# Patient Record
Sex: Male | Born: 1950 | Race: White | Hispanic: No | Marital: Married | Smoking: Never smoker
Health system: Southern US, Community
[De-identification: ages and names within clinical notes are randomized; demographics above are authoritative.]

## PROBLEM LIST (undated history)

## (undated) DIAGNOSIS — I509 Heart failure, unspecified: Secondary | ICD-10-CM

## (undated) DIAGNOSIS — IMO0002 Reserved for concepts with insufficient information to code with codable children: Secondary | ICD-10-CM

## (undated) DIAGNOSIS — J45909 Unspecified asthma, uncomplicated: Secondary | ICD-10-CM

## (undated) DIAGNOSIS — I10 Essential (primary) hypertension: Secondary | ICD-10-CM

## (undated) DIAGNOSIS — E119 Type 2 diabetes mellitus without complications: Secondary | ICD-10-CM

## (undated) DIAGNOSIS — E785 Hyperlipidemia, unspecified: Secondary | ICD-10-CM

## (undated) DIAGNOSIS — G4730 Sleep apnea, unspecified: Secondary | ICD-10-CM

## (undated) HISTORY — DX: Essential (primary) hypertension: I10

## (undated) HISTORY — DX: Type 2 diabetes mellitus without complications: E11.9

## (undated) HISTORY — DX: Unspecified asthma, uncomplicated: J45.909

## (undated) HISTORY — DX: Reserved for concepts with insufficient information to code with codable children: IMO0002

## (undated) HISTORY — DX: Sleep apnea, unspecified: G47.30

## (undated) HISTORY — DX: Hyperlipidemia, unspecified: E78.5

---

## 1994-05-14 ENCOUNTER — Emergency Department: Admit: 1994-05-14 | Payer: Self-pay | Source: Emergency Department

## 2008-05-20 ENCOUNTER — Emergency Department
Admission: EM | Admit: 2008-05-20 | Disposition: A | Discharge status: Home / Self Care | Payer: Self-pay | Source: Emergency Department | Admitting: Emergency Medicine

## 2009-11-12 ENCOUNTER — Emergency Department
Admission: EM | Admit: 2009-11-12 | Disposition: A | Discharge status: Home / Self Care | Payer: Self-pay | Source: Emergency Department | Admitting: Emergency Medicine

## 2009-11-13 LAB — CBC AND DIFFERENTIAL
BASOPHILS %: 0.2 % (ref 0.0–2.0)
Baso(Absolute): 0.02 10*3/uL (ref 0.00–0.20)
Eosinophils %: 0.8 % (ref 0.0–6.0)
Eosinophils Absolute: 0.07 10*3/uL — ABNORMAL LOW (ref 0.10–0.30)
Hematocrit: 40.6 % (ref 39.0–49.0)
Hemoglobin: 13.4 g/dL (ref 13.2–17.3)
Immature Granulocytes #: 0.02 10*3/uL (ref 0.00–0.05)
Immature Granulocytes %: 0.2 % — ABNORMAL HIGH (ref 0.0–0.0)
Lymphocytes Absolute: 2.23 10*3/uL (ref 1.00–4.80)
Lymphocytes Relative: 24.2 % — ABNORMAL LOW (ref 25.0–55.0)
MCH: 28.2 pg (ref 27.0–34.0)
MCHC: 33 g/dL (ref 32.0–36.0)
MCV: 85.5 fL (ref 80–100)
MPV: 10.4 fL (ref 9.0–13.0)
Monocytes Absolute: 0.94 10*3/uL (ref 0.10–1.20)
Monocytes Relative %: 10.2 % — ABNORMAL HIGH (ref 1.0–8.0)
Neutrophils Absolute: 5.97 10*3/uL (ref 1.80–7.70)
Neutrophils Relative %: 64.6 % (ref 49.0–69.0)
Nucleated RBC %: 0 /100WBC (ref 0.0–0.0)
Nucleted RBC #: 0 10*3/uL (ref 0.00–0.00)
Platelets: 271 10*3/uL (ref 150–400)
RBC: 4.75 M/uL (ref 3.80–5.40)
RDW: 14.2 % — ABNORMAL HIGH (ref 11.0–14.0)
WBC: 9.23 10*3/uL (ref 4.80–10.80)

## 2009-11-13 LAB — COMPREHENSIVE METABOLIC PANEL
ALT: 91 U/L — ABNORMAL HIGH (ref 7–56)
AST (SGOT): 44 U/L — ABNORMAL HIGH (ref 5–40)
Albumin, Synovial: 4.1 g/dL (ref 3.9–5.0)
Alkaline Phosphatase: 160 U/L — ABNORMAL HIGH (ref 38–126)
BUN / Creatinine Ratio: 17 (ref 8–20)
BUN: 19 mg/dL (ref 6–20)
Bilirubin, Total: 0.1 mg/dL — ABNORMAL LOW (ref 0.2–1.3)
CO2: 29 mmol/L (ref 21.0–31.0)
Calcium: 8.5 mg/dL (ref 8.4–10.2)
Chloride: 104 mmol/L (ref 101–111)
Creatinine: 1.17 mg/dL (ref 0.66–1.25)
EGFR: >60 mL/min/{1.73_m2}
EGFR: >60 mL/min/{1.73_m2}
Glucose: 128 mg/dL — ABNORMAL HIGH (ref 70–100)
Potassium: 4.4 mmol/L (ref 3.6–5.0)
Protein, Total: 6.6 g/dL (ref 6.3–8.2)
Sodium: 143 mmol/L (ref 135–145)

## 2009-11-13 LAB — B-TYPE NATRIURETIC PEPTIDE: B-Natriuretic Peptide: 13 pg/mL (ref 0–100)

## 2009-11-13 LAB — MAGNESIUM: Magnesium: 2.3 mg/dL — ABNORMAL HIGH (ref 1.7–2.2)

## 2010-10-27 ENCOUNTER — Ambulatory Visit
Admission: RE | Admit: 2010-10-27 | Disposition: A | Discharge status: Home / Self Care | Payer: Self-pay | Source: Ambulatory Visit

## 2012-03-12 LAB — ECG 12-LEAD
Atrial Rate: 81 {beats}/min
P Axis: 27 degrees
P-R Interval: 156 ms
Q-T Interval: 390 ms
QRS Duration: 96 ms
QTC Calculation (Bezet): 453 ms
R Axis: -2 degrees
T Axis: 11 degrees
Ventricular Rate: 81 {beats}/min

## 2012-04-06 ENCOUNTER — Ambulatory Visit: Payer: Self-pay

## 2012-04-13 ENCOUNTER — Ambulatory Visit: Payer: BLUE CROSS/BLUE SHIELD | Attending: Specialist

## 2012-04-13 VITALS — BP 155/94 | Ht 71.0 in | Wt 261.0 lb

## 2012-04-13 DIAGNOSIS — E1149 Type 2 diabetes mellitus with other diabetic neurological complication: Secondary | ICD-10-CM | POA: Insufficient documentation

## 2012-04-13 NOTE — Progress Notes (Signed)
Nutrition Assessment    Eating History:  Meal times:  Breakfast 10am   Lunch  2pm   Supper 7:30pm  Do you eat snacks? No  When?      Weight Category: Obesity Grade II  Last Vitals: Ht 1.803 m (5\' 11" )  Wt 118.389 kg (261 lb)  BMI 36.40 kg/m2  Pre Pregnant Weight   Recommended Weight goal  132-179 pounds  Est calorie needs  1893  Activity factor: Sedentary - 600 calories for weight loss  Total carbs recommended  212  Number of carb choices recommended 14    Comments:

## 2012-04-13 NOTE — Progress Notes (Signed)
Kalman here 1 week early for assessment visit. Retired Technical sales engineer x 1 month. Seen by Dr Delynn Flavin A1c was 11% he thinks it's around 7 % now. Wife has diabetes on insulin. Glade states she knows a lot about diabetes but it is time he did. Recent 60 pound weight gain, he attributes this to back pain and untreated sleep apnea - he is claustrophobic and unable to tolerate devices. He has not checked BG in last month and often misses doses of Glucophage. Willing to check 3x day for class and write notes in log. Will get pill box to assist in regular medication times.  Blood pressure elevated today at 155/94, gave [employer] and asked that he check serial B/P and report elevations to MD. Dislikes most vegetables.

## 2012-04-22 ENCOUNTER — Ambulatory Visit: Payer: BLUE CROSS/BLUE SHIELD | Attending: Specialist

## 2012-04-22 DIAGNOSIS — E1149 Type 2 diabetes mellitus with other diabetic neurological complication: Secondary | ICD-10-CM | POA: Insufficient documentation

## 2012-04-22 NOTE — Progress Notes (Signed)
Fasting's are 120-130. Testing more that 3 x daily. Richard Lawrence states [name] wants more readings and encourages hs test.

## 2012-05-01 ENCOUNTER — Ambulatory Visit: Payer: BLUE CROSS/BLUE SHIELD | Admitting: Registered"

## 2012-05-01 NOTE — Progress Notes (Signed)
Reports 12.2% a1c decreased to 6.5% and loss of 10lbs.  Food records show good understanding of carb counting. Clarified a few portion sizes and recommend consistent carb amounts.

## 2012-06-05 ENCOUNTER — Ambulatory Visit: Payer: BLUE CROSS/BLUE SHIELD | Attending: Specialist

## 2012-06-05 DIAGNOSIS — E1149 Type 2 diabetes mellitus with other diabetic neurological complication: Secondary | ICD-10-CM | POA: Insufficient documentation

## 2012-07-06 ENCOUNTER — Ambulatory Visit: Payer: BLUE CROSS/BLUE SHIELD | Attending: Specialist | Admitting: Registered"

## 2012-07-06 VITALS — BP 138/97 | Wt 261.0 lb

## 2012-07-06 DIAGNOSIS — E1149 Type 2 diabetes mellitus with other diabetic neurological complication: Secondary | ICD-10-CM | POA: Insufficient documentation

## 2012-12-28 ENCOUNTER — Inpatient Hospital Stay: Payer: BLUE CROSS/BLUE SHIELD | Admitting: Internal Medicine

## 2012-12-28 ENCOUNTER — Inpatient Hospital Stay
Admission: EM | Admit: 2012-12-28 | Discharge: 2012-12-31 | DRG: 603 | Disposition: A | Discharge status: Home / Self Care | Payer: BLUE CROSS/BLUE SHIELD | Attending: Internal Medicine | Admitting: Internal Medicine

## 2012-12-28 DIAGNOSIS — E119 Type 2 diabetes mellitus without complications: Secondary | ICD-10-CM | POA: Diagnosis present

## 2012-12-28 DIAGNOSIS — D72829 Elevated white blood cell count, unspecified: Secondary | ICD-10-CM | POA: Diagnosis present

## 2012-12-28 DIAGNOSIS — Z880 Allergy status to penicillin: Secondary | ICD-10-CM

## 2012-12-28 DIAGNOSIS — L0201 Cutaneous abscess of face: Principal | ICD-10-CM | POA: Diagnosis present

## 2012-12-28 DIAGNOSIS — J45909 Unspecified asthma, uncomplicated: Secondary | ICD-10-CM | POA: Diagnosis present

## 2012-12-28 DIAGNOSIS — I509 Heart failure, unspecified: Secondary | ICD-10-CM | POA: Diagnosis present

## 2012-12-28 DIAGNOSIS — L03211 Cellulitis of face: Principal | ICD-10-CM | POA: Diagnosis present

## 2012-12-28 DIAGNOSIS — E785 Hyperlipidemia, unspecified: Secondary | ICD-10-CM | POA: Diagnosis present

## 2012-12-28 DIAGNOSIS — G4730 Sleep apnea, unspecified: Secondary | ICD-10-CM | POA: Diagnosis present

## 2012-12-28 DIAGNOSIS — I10 Essential (primary) hypertension: Secondary | ICD-10-CM | POA: Diagnosis present

## 2012-12-28 DIAGNOSIS — M5126 Other intervertebral disc displacement, lumbar region: Secondary | ICD-10-CM | POA: Diagnosis present

## 2012-12-28 HISTORY — DX: Heart failure, unspecified: I50.9

## 2012-12-28 LAB — CBC AND DIFFERENTIAL
Basophils Absolute Automated: 0.02 (ref 0.00–0.20)
Basophils Automated: 0 %
Eosinophils Absolute Automated: 0.17 (ref 0.00–0.70)
Eosinophils Automated: 2 %
Hematocrit: 42.6 % (ref 42.0–52.0)
Hgb: 13.8 g/dL (ref 13.0–17.0)
Immature Granulocytes Absolute: 0.02
Immature Granulocytes: 0 %
Lymphocytes Absolute Automated: 2.66 (ref 0.50–4.40)
Lymphocytes Automated: 24 %
MCH: 28.2 pg (ref 28.0–32.0)
MCHC: 32.4 g/dL (ref 32.0–36.0)
MCV: 87.1 fL (ref 80.0–100.0)
MPV: 10 fL (ref 9.4–12.3)
Monocytes Absolute Automated: 0.99 (ref 0.00–1.20)
Monocytes: 9 %
Neutrophils Absolute: 7.21 (ref 1.80–8.10)
Neutrophils: 65 %
Platelets: 313 (ref 140–400)
RBC: 4.89 (ref 4.70–6.00)
RDW: 14 % (ref 12–15)
WBC: 11.05 — ABNORMAL HIGH (ref 3.50–10.80)

## 2012-12-28 LAB — COMPREHENSIVE METABOLIC PANEL
ALT: 41 U/L (ref 0–55)
AST (SGOT): 20 U/L (ref 5–34)
Albumin/Globulin Ratio: 1.3 (ref 0.9–2.2)
Albumin: 4 g/dL (ref 3.5–5.0)
Alkaline Phosphatase: 102 U/L (ref 40–150)
Anion Gap: 10 (ref 5.0–15.0)
BUN: 21.2 mg/dL — ABNORMAL HIGH (ref 9.0–21.0)
Bilirubin, Total: 0.4 mg/dL (ref 0.2–1.2)
CO2: 30 — ABNORMAL HIGH (ref 22–29)
Calcium: 9.4 mg/dL (ref 8.5–10.5)
Chloride: 102 (ref 98–107)
Creatinine: 1.1 mg/dL (ref 0.7–1.3)
Globulin: 3.2 g/dL (ref 2.0–3.6)
Glucose: 129 mg/dL — ABNORMAL HIGH (ref 70–100)
Potassium: 4 (ref 3.5–5.1)
Protein, Total: 7.2 g/dL (ref 6.0–8.3)
Sodium: 142 (ref 136–145)

## 2012-12-28 LAB — POCT GLUCOSE
Whole Blood Glucose POCT: 90 mg/dL (ref 70–100)
Whole Blood Glucose POCT: 97 mg/dL (ref 70–100)
Whole Blood Glucose POCT: 87 mg/dL (ref 70–100)

## 2012-12-28 LAB — GFR: EGFR: >60

## 2012-12-28 LAB — C-REACTIVE PROTEIN: C-Reactive Protein: 0.5 mg/dL (ref 0.0–0.8)

## 2012-12-28 MED ORDER — DEXTROSE 50 % IV SOLN
25.0000 mL | INTRAVENOUS | Status: DC | PRN
Start: 2012-12-28 — End: 2012-12-31

## 2012-12-28 MED ORDER — GLUCOSE 40 % PO GEL
15.0000 g | ORAL | Status: DC | PRN
Start: 2012-12-28 — End: 2012-12-31

## 2012-12-28 MED ORDER — VANCOMYCIN HCL 1000 MG IV SOLR
1750.00 mg | Freq: Two times a day (BID) | INTRAVENOUS | Status: DC
Start: 2012-12-28 — End: 2012-12-30
  Administered 2012-12-28 – 2012-12-30 (×4): 1750 mg via INTRAVENOUS
  Filled 2012-12-28 (×5): qty 1750

## 2012-12-28 MED ORDER — ZOLPIDEM TARTRATE 5 MG PO TABS
5.0000 mg | ORAL_TABLET | Freq: Every evening | ORAL | Status: DC | PRN
Start: 2012-12-28 — End: 2012-12-31

## 2012-12-28 MED ORDER — FUROSEMIDE 40 MG PO TABS
40.00 mg | ORAL_TABLET | Freq: Two times a day (BID) | ORAL | Status: DC
Start: 2012-12-28 — End: 2012-12-30
  Administered 2012-12-28 – 2012-12-30 (×3): 40 mg via ORAL
  Filled 2012-12-28 (×5): qty 1

## 2012-12-28 MED ORDER — VANCOMYCIN 1000 MG IN 250 ML NS IVPB VIAL-MATE (CNR)
1000.0000 mg | Freq: Once | INTRAVENOUS | Status: AC
Start: 2012-12-28 — End: 2012-12-28
  Administered 2012-12-28: 1000 mg via INTRAVENOUS
  Filled 2012-12-28: qty 250

## 2012-12-28 MED ORDER — SODIUM CHLORIDE 0.9 % IV MBP
1.0000 g | INTRAVENOUS | Status: DC
Start: 2012-12-29 — End: 2012-12-31
  Administered 2012-12-29 – 2012-12-31 (×3): 1 g via INTRAVENOUS
  Filled 2012-12-28 (×3): qty 1000

## 2012-12-28 MED ORDER — RISAQUAD PO CAPS
1.0000 | ORAL_CAPSULE | Freq: Every day | ORAL | Status: DC
Start: 2012-12-28 — End: 2012-12-31
  Administered 2012-12-31: 1 via ORAL
  Filled 2012-12-28 (×4): qty 1

## 2012-12-28 MED ORDER — INSULIN ASPART 100 UNIT/ML SC SOLN
1.0000 [IU] | Freq: Three times a day (TID) | SUBCUTANEOUS | Status: DC | PRN
Start: 2012-12-28 — End: 2012-12-31

## 2012-12-28 MED ORDER — ENOXAPARIN SODIUM 40 MG/0.4ML SC SOLN
40.0000 mg | Freq: Every day | SUBCUTANEOUS | Status: DC
Start: 2012-12-28 — End: 2012-12-31
  Filled 2012-12-28 (×3): qty 0.4

## 2012-12-28 MED ORDER — MORPHINE SULFATE 2 MG/ML IJ/IV SOLN (WRAP)
2.0000 mg | Status: DC | PRN
Start: 2012-12-28 — End: 2012-12-31

## 2012-12-28 MED ORDER — ACETAMINOPHEN 325 MG PO TABS
650.0000 mg | ORAL_TABLET | ORAL | Status: DC | PRN
Start: 2012-12-28 — End: 2012-12-31

## 2012-12-28 MED ORDER — MONTELUKAST SODIUM 10 MG PO TABS
10.0000 mg | ORAL_TABLET | Freq: Every evening | ORAL | Status: DC
Start: 2012-12-28 — End: 2012-12-31
  Administered 2012-12-28 – 2012-12-30 (×3): 10 mg via ORAL
  Filled 2012-12-28 (×3): qty 1

## 2012-12-28 MED ORDER — VANCOMYCIN HCL 1000 MG IV SOLR
1000.0000 mg | Freq: Two times a day (BID) | INTRAVENOUS | Status: DC
Start: 2012-12-28 — End: 2012-12-28

## 2012-12-28 MED ORDER — ONDANSETRON HCL 4 MG/2ML IJ SOLN
4.0000 mg | Freq: Four times a day (QID) | INTRAMUSCULAR | Status: DC | PRN
Start: 2012-12-28 — End: 2012-12-31

## 2012-12-28 MED ORDER — CARVEDILOL 12.5 MG PO TABS
12.5000 mg | ORAL_TABLET | Freq: Every morning | ORAL | Status: DC
Start: 2012-12-28 — End: 2012-12-31
  Administered 2012-12-29 – 2012-12-31 (×3): 12.5 mg via ORAL
  Filled 2012-12-28 (×3): qty 1

## 2012-12-28 MED ORDER — CARVEDILOL 3.125 MG PO TABS
6.25 mg | ORAL_TABLET | Freq: Every day | ORAL | Status: DC
Start: 2012-12-28 — End: 2012-12-31
  Administered 2012-12-28 – 2012-12-30 (×3): 6.25 mg via ORAL
  Filled 2012-12-28 (×4): qty 2

## 2012-12-28 MED ORDER — GLUCAGON HCL (RDNA) 1 MG IJ SOLR
1.0000 mg | INTRAMUSCULAR | Status: DC | PRN
Start: 2012-12-28 — End: 2012-12-31

## 2012-12-28 MED ORDER — SODIUM CHLORIDE 0.9 % IV MBP
1.0000 g | Freq: Once | INTRAVENOUS | Status: AC
Start: 2012-12-28 — End: 2012-12-28
  Administered 2012-12-28: 1 g via INTRAVENOUS
  Filled 2012-12-28: qty 50
  Filled 2012-12-28: qty 1000

## 2012-12-28 MED ORDER — VANCOMYCIN HCL 1000 MG IV SOLR
1500.0000 mg | Freq: Once | INTRAVENOUS | Status: AC
Start: 2012-12-28 — End: 2012-12-28
  Administered 2012-12-28: 1500 mg via INTRAVENOUS
  Filled 2012-12-28: qty 1500

## 2012-12-28 NOTE — ED Notes (Signed)
Redness beginning Thursday at tip of nose; progressed throughout week [facility] and diagnosed with cellulitis; pt presents with worsening of redness and swelling to nose right eye and right cheek; denies difficulty swallowing/SOB/CP; took 50mg  benadryl 0530

## 2012-12-28 NOTE — H&P (Signed)
[facility] Hospitalist H&P.      Date Time: 12/28/2012  10:41 AM  Patient Name:Richard Lawrence  YSA:[id number]  PCP: Royal Piedra, MD  Attending Physician:Torrance Frech Erick Blinks M.D.,    Assessment/Plan   62 yo male here with facial cellulitis    Principal Problem:   *Facial cellulitis  Active Problems:   Diabetes mellitus without complication   Leucocytosis   HTN (hypertension)    1. Facial cellulitis   - continue IV abx with rocephin and vanc per ID recs  2. DM II  - SSI  3. HTN - continue home medications  4. Leukocytosis - 2nd to cellulitis      DVT Prohylaxis:lovenox  Code Status: No Order   Disposition:home  Prognosis:good  Type of Admission:observation  Estimated Length of Stay (including stay in the ER receiving treatment): 2-3 days  Medical Necessity for stay:facial cellulitis failed outpatient therapy    Chief Complaint:     Chief Complaint   Patient presents with   . Facial Redness        History of Present Illness:   Richard Lawrence is a 62 y.o. male who has history of History reviewed. No pertinent past surgical history.   Past Medical History   Diagnosis Date   . Diabetes mellitus type II    . Sleep apnea      untreated not able to tolerate mask or nose pillow   . Essential hypertension    . Hyperlipidemia    . Asthma    . Herniated disc      L4 L5 L6   . CHF (congestive heart failure)     came with the chief complaint of facial cellulitis.  Pt started facial redness and swelling started a couple days ago.  He went to urgent care and was given abx.  He took abx but symptoms continue to worsen.  He states he has redness, warmth, swelling and chills, no tenderness, no fever.  He has no visual problem, no blurry vision, no double vision, no cp, no sob, no abd pain, no nausea, no vomiting, no weakness numbness tingling.     Past Medical History:     Past Medical History   Diagnosis Date   . Diabetes mellitus type II    . Sleep apnea      untreated not able to tolerate mask or nose pillow   . Essential hypertension    .  Hyperlipidemia    . Asthma    . Herniated disc      L4 L5 L6   . CHF (congestive heart failure)        Past Surgical History:     History reviewed. No pertinent past surgical history.    Family History:     DM, cancer, HTN    Social History:     History   Alcohol Use No     History   Drug Use Not on file     History   Smoking status   . Never Smoker    Smokeless tobacco   . Not on file     History     Social History   . Marital Status: Married     Spouse Name: N/A     Number of Children: N/A   . Years of Education: N/A     Social History Main Topics   . Smoking status: Never Smoker    . Smokeless tobacco: None   . Alcohol Use: No   . Drug  Use: None   . Sexually Active: None     Other Topics Concern   . None     Social History Narrative   . None       Allergies:     Allergies   Allergen Reactions   . Erythromycin Diarrhea   . Levaquin (Levofloxacin Hemihydrate) Angioedema   . Lipitor (Atorvastatin Calcium)    . Other      Molds and dust   . Penicillins Hives and Rash       Medications:     Prescriptions prior to admission   Medication Sig Dispense Refill   . carvedilol (COREG) 12.5 MG tablet Take 12.5 mg by mouth daily.       . carvedilol (COREG) 6.25 MG tablet Take 6.25 mg by mouth Daily after dinner.       . Furosemide (LASIX PO) Take by mouth. Takes 40 mg in AM and 80 mg in PM       . loratadine (CLARITIN) 10 MG tablet Take 20 mg by mouth daily.       . metFORMIN (GLUCOPHAGE) 500 MG tablet Take 1,000 mg by mouth 2 (two) times daily with meals.       . montelukast (SINGULAIR) 10 MG tablet Take 10 mg by mouth nightly.       Marland Kitchen albuterol (PROVENTIL) (5 MG/ML) 0.5% nebulizer solution Take 2.5 mg by nebulization every 6 (six) hours as needed.           Review of Systems:     Constitutional: Negative for chills, weight loss, malaise/fatigue and diaphoresis.   HENT: Negative for hearing loss, ear pain, + redness right side of nasal bridge, warmth and swelling, congestion, sore throat, neck pain, tinnitus and ear  discharge.    Eyes: Negative for blurred vision, double vision, photophobia, pain, discharge and redness.   Respiratory: Negative for cough, hemoptysis, sputum production, shortness of breath, wheezing and stridor.    Cardiovascular: Negative for chest pain, palpitations, orthopnea, claudication, leg swelling and PND.   Gastrointestinal: Negative for heartburn, nausea, vomiting, abdominal pain, diarrhea, constipation, blood in stool and melena.   Genitourinary: Negative for dysuria, urgency, frequency, hematuria and flank pain.   Musculoskeletal: Negative for myalgias, back pain, joint pain and falls.   Skin: Negative for itching and rash.   Neurological: Negative for dizziness, tingling, tremors, sensory change, speech change, focal weakness, seizures, loss of consciousness, weakness and headaches.   Endo/Heme/Allergies: Negative for environmental allergies and polydipsia. Does not bruise/bleed easily.   Psychiatric/Behavioral: Negative for depression, suicidal ideas, hallucinations, memory loss and substance abuse. The patient is not nervous/anxious and does not have insomnia.           Physical Exam:    height is 1.778 m (5\' 10" ) and weight is 116.574 kg (257 lb). His temporal artery temperature is 98.4 F (36.9 C). His blood pressure is 159/90 and his pulse is 88. His respiration is 18 and oxygen saturation is 100%.   Body mass index is 36.88 kg/(m^2).  Filed Vitals:    12/28/12 0621 12/28/12 0817 12/28/12 0948   BP: 178/88 141/96 159/90   Pulse: 89 72 88   Temp: 97.8 F (36.6 C)  98.4 F (36.9 C)   TempSrc:   Temporal Artery   Resp: 18     Height: 1.778 m (5\' 10" )     Weight: 116.574 kg (257 lb)     SpO2: 99% 93% 100%       Constitutional: Patient is oriented  to person, place, and time. Patient appears well-developed and well-nourished.   Head: Normocephalic and atraumatic.  Eyes- pupils equal and reactive, extraocular eye movements intact, sclera anicteric  Ears - bilateral TM's and external ear canals  normal, right ear normal, left ear normal  Nose - normal and patent, no erythema, discharge or polyps and normal nontender sinuses + redness right side of nasal bridge, warmth and swelling,  Mouth - mucous membranes moist, pharynx normal without lesions  Neck: Normal range of motion. Neck supple. No JVD present. No tracheal deviation present. No thyromegaly present.   Cardiovascular: Normal rate, regular rhythm, normal heart sounds and intact distal pulses.  Exam reveals no gallop and no friction rub. No murmur heard.  Pulmonary/Chest: Effort normal and breath sounds normal. No stridor. No respiratory distress. Patient has no wheezes. No rales were present.  Exhibits no tenderness.   Abdominal: Soft. Bowel sounds are normal. Patient exhibits no distension and no mass was palpable. There is no tenderness. There is no rebound and no guarding.   Musculoskeletal: Normal range of motion. Patient exhibits no edema and no tenderness.   Lymphadenopathy:  Patient has no cervical adenopathy.   Neurological: Patient is alert and oriented to person, place, and time and has normal reflexes. No cranial nerve deficit.  Normal muscle tone. Coordination normal.   Skin: Skin is warm. No rash noted. Patient is not diaphoretic. No erythema. No pallor.   Psychiatric: Has normal mood and affect. Behavior is normal. Judgment and thought content normal.          Labs:     Results for orders placed during the hospital encounter of 12/28/12   CBC AND DIFFERENTIAL       Component Value Range    WBC 11.05 (*) 3.50 - 10.80    RBC 4.89  4.70 - 6.00    Hgb 13.8  13.0 - 17.0 g/dL    Hematocrit 16.1  09.6 - 52.0 %    MCV 87.1  80.0 - 100.0 fL    MCH 28.2  28.0 - 32.0 pg    MCHC 32.4  32.0 - 36.0 g/dL    RDW 14  12 - 15 %    Platelets 313  140 - 400    MPV 10.0  9.4 - 12.3 fL    Neutrophils 65  None %    Lymphocytes Automated 24  None %    Monocytes 9  None %    Eosinophils Automated 2  None %    Basophils Automated 0  None %    Immature Granulocyte  0  None %    Neutrophils Absolute 7.21  1.80 - 8.10    Abs Lymph Automated 2.66  0.50 - 4.40    Abs Mono Automated 0.99  0.00 - 1.20    Abs Eos Automated 0.17  0.00 - 0.70    Absolute Baso Automated 0.02  0.00 - 0.20    Absolute Immature Granulocyte 0.02  0   COMPREHENSIVE METABOLIC PANEL       Component Value Range    Glucose 129 (*) 70 - 100 mg/dL    BUN 04.5 (*) 9.0 - 40.9 mg/dL    Creatinine 1.1  0.7 - 1.3 mg/dL    Sodium 811  914 - 782    Potassium 4.0  3.5 - 5.1    Chloride 102  98 - 107    CO2 30 (*) 22 - 29    CALCIUM 9.4  8.5 - 10.5  mg/dL    Protein, Total 7.2  6.0 - 8.3 g/dL    Albumin 4.0  3.5 - 5.0 g/dL    AST (SGOT) 20  5 - 34 U/L    ALT 41  0 - 55 U/L    Alkaline Phosphatase 102  40 - 150 U/L    Bilirubin, Total 0.4  0.2 - 1.2 mg/dL    Globulin 3.2  2.0 - 3.6 g/dL    Albumin/Globulin Ratio 1.3  0.9 - 2.2    Anion Gap 10.0  5.0 - 15.0   C-REACTIVE PROTEIN       Component Value Range    C-Reactive Protein 0.5  0.0 - 0.8 mg/dL   GFR       Component Value Range    EGFR >60.0         EKG       Rads:     Radiology Results (24 Hour)     ** No Results found for the last 24 hours. **        chest X-ray    Total Time spent for Admission:   65 minutes >50% in direct pt care      Signed by: Mary Sella 12/28/2012 10:41 AM

## 2012-12-28 NOTE — Consults (Signed)
Full consult dictated  Plan:  Vancomycin and ceftriaxone empirically  Follow exam, temp, wbc  Supportive care  Will tailor Abx according to pt's clinical course and Cx results  Thank you for allwoing me to participate in the care of this pt

## 2012-12-28 NOTE — Consults (Addendum)
Department of Pharmacy Vancomycin Dosing Consult:  Initiation    MRN: [id number]  Room/Bed: M247/M247-A    Richard Lawrence is being treated with Vancomycin for facial cellulitis.  Pharmacy was consulted to dose Vancomycin by Dr.Hartojo.    Dosing was based on the following parameters:  Pt Age:  62 y.o.  Pt Weight:  116.57kg  Pt Height:  70inches  Estimated Creatinine Clearance: 91.3 ml/min (based on Cr of 1.1).  Target trough: 17.5 mcg/mL    The following orders were entered in EPIC:  Vancomycin loading dose of 2500 mg IV( 1000 mg plus 1500 mg)   Vancomycin maintenance dose of 1750 mg IV every 12 hours.  Vancomycin trough prior to the 4th dose.    Pharmacy will monitor drug levels and kidney function and adjust doses as necessary.    Thank you for the consult.  Please contact the pharmacy with any questions at  [phone].    Signed:  Mearl Latin  Date/Time 12/28/2012 10:22 AM        [facility] Department of Pharmacy Vancomycin Kinetics Dosing Consult:  Dose Adjustment    MRN: [id number]  Room/Bed:  U202/R427-C    Richard Lawrence  is being treated with Vancomycin for acial cellulitis.  Pharmacy was consulted to dose Vancomycin by Dr. Ricka Burdock.    Based on the following parameters:  Pt Age:  62 y.o.  Pt Weight:  116.6kg  Pt Height:  70 inches  Estimated Creatinine Clearance: 91.3 ml/min (based on Cr of 1.1).  Target trough: 17.5 mcg/mL    Vancomycin Trough   Date Value Range Status   12/30/2012 19.3  10.0 - 20.0 ug/mL Final        Based on the current Vancomycin trough drawn prior to the 4th dose, Vancomycin dose was changed to 1500 mg IV every 12 hrs.    Please check Vancomycin trough prior to 4th dose.  Pharmacy will continue to monitor drug level and kidney function and adjust doses as necessary.    Thank you for the consult.  Please contact the pharmacy with any questions at  [phone].    Signed:  Harrie Foreman    Date/Time 12/30/2012 1:47 PM

## 2012-12-28 NOTE — ED Provider Notes (Signed)
Physician/Midlevel provider first contact with patient: 12/28/12 1610         History     Chief Complaint   Patient presents with   . Facial Redness      HPI Comments:   Chief Complaint: redness to face  Onset/Duration: 4-5 days  Quality: constant, worsening  Severity:moderate  Aggravating Factors: unknown, started as redness and tenderness on nose   Alleviating Factors: [facility], redness has spread laterally to r cheek and mild amount to left cheek  Associated Symptoms/ Additional Comments: no fever, mild pain, warmth. No cp/n/v/abd pain.        The history is provided by the patient.       Past Medical History   Diagnosis Date   . Diabetes mellitus type II    . Sleep apnea      untreated not able to tolerate mask or nose pillow   . Essential hypertension    . Hyperlipidemia    . Asthma    . Herniated disc      L4 L5 L6   . CHF (congestive heart failure)        History reviewed. No pertinent past surgical history.    No family history on file.    Social  History   Substance Use Topics   . Smoking status: Never Smoker    . Smokeless tobacco: Not on file   . Alcohol Use: No       .     Allergies   Allergen Reactions   . Erythromycin Diarrhea   . Levaquin (Levofloxacin Hemihydrate)    . Lipitor (Atorvastatin Calcium)    . Other      Molds and dust   . Penicillins        Current/Home Medications    ALBUTEROL (PROVENTIL) (5 MG/ML) 0.5% NEBULIZER SOLUTION    Take 2.5 mg by nebulization every 6 (six) hours as needed.    FUROSEMIDE (LASIX PO)    Take by mouth. Takes 40 mg in AM and 80 mg in PM    LORATADINE (CLARITIN) 10 MG TABLET    Take 20 mg by mouth daily.    METFORMIN (GLUCOPHAGE) 500 MG TABLET    Take 1,000 mg by mouth 2 (two) times daily with meals.    MONTELUKAST (SINGULAIR) 10 MG TABLET    Take 10 mg by mouth nightly.        Review of Systems   Constitutional: Negative for fever and chills.   HENT: Negative for sore throat and neck pain.    Respiratory: Negative for cough and  shortness of breath.    Cardiovascular: Negative for chest pain and palpitations.   Gastrointestinal: Negative for nausea, vomiting and abdominal pain.   Musculoskeletal: Negative for back pain.   Skin: Positive for rash. Negative for pallor and wound.   Neurological: Negative for dizziness, syncope, weakness and headaches.   All other systems reviewed and are negative.        Physical Exam    BP 178/88  Pulse 89  Temp 97.8 F (36.6 C)  Resp 18  Ht 1.778 m  Wt 116.574 kg  BMI 36.88 kg/m2  SpO2 99%    Physical Exam   Nursing note and vitals reviewed.  Constitutional: He is oriented to person, place, and time. He appears distressed.   HENT:   Head: Normocephalic and atraumatic.   Nose: Nose normal.   Mouth/Throat: Oropharynx is clear and moist. No  oropharyngeal exudate.   Eyes: Conjunctivae normal and EOM are normal.   Neck: Normal range of motion. Neck supple.   Cardiovascular: Normal rate, regular rhythm, normal heart sounds and intact distal pulses.    Pulmonary/Chest: Effort normal and breath sounds normal. No respiratory distress.   Abdominal: Soft. Bowel sounds are normal. He exhibits no distension. There is no tenderness. There is no CVA tenderness.   Musculoskeletal: Normal range of motion. He exhibits no edema.   Neurological: He is alert and oriented to person, place, and time. He has normal strength. No cranial nerve deficit or sensory deficit. Coordination normal.   Skin: Skin is warm and dry. Rash noted. There is erythema.        Erythema, warmth with minimal tenderness to nose to r face and some amount to the left face. No wound or drainage. Mild r eyelid swelling.       MDM and ED Course     ED Medication Orders      Start     Status Ordering Provider    12/28/12 [phone]   cefTRIAXone (ROCEPHIN) 1 g in sodium chloride 0.9 % 50 mL IVPB mini-bag plus   Once      Route: Intravenous  Ordered Dose: 1 g         Last MAR action:  New Bag Dede Dobesh    12/28/12 6213   vancomycin (VANCOCIN) 1000mg  in  sodium chloride 0.9% 250 mL IVPB   Once      Route: Intravenous  Ordered Dose: 1,000 mg         Acknowledged Helayne Seminole                 MDM  Number of Diagnoses or Management Options  Diagnosis management comments: DR. Helayne Seminole  is the primary attending for this patient and has obtained and performed the history, PE, and medical decision making for this patient.          ddx- cellulitis, allergic rxn.           Amount and/or Complexity of Data Reviewed  Clinical lab tests: ordered and reviewed      Pt given iv abx in ed.    Pt with signs and symptoms of cellulitis and is at potential risk for septicemia and worsening infection, failed op abx.  Therefore pt warrants admission for IV antibiotics.    I spoke to hospitalist regarding admission. Pt presentation, course and results relayed to admitting doctor.    Results     Procedure Component Value Units Date/Time    Comprehensive Metabolic Panel (CMP) [[id number]]  (Abnormal) Collected:12/28/12 0649    Specimen Information:Blood Updated:12/28/12 0712     Glucose 129 (H) mg/dL      BUN 69.6 (H) mg/dL      Creatinine 1.1 mg/dL      Sodium 295      Potassium 4.0      Chloride 102      CO2 30 (H)      CALCIUM 9.4 mg/dL      Protein, Total 7.2 g/dL      Albumin 4.0 g/dL      AST (SGOT) 20 U/L      ALT 41 U/L      Alkaline Phosphatase 102 U/L      Bilirubin, Total 0.4 mg/dL      Globulin 3.2 g/dL      Albumin/Globulin Ratio 1.3      Anion Gap 10.0  C-reactive Protein (CRP) [[id number]] Collected:12/28/12 0649    Specimen Information:Blood Updated:12/28/12 0712     C-Reactive Protein 0.5 mg/dL     GFR [[id number]] VFIEPPIRJ:18/84/16 0649     EGFR >60.0 Updated:12/28/12 0712    CBC and differential [[id number]]  (Abnormal) Collected:12/28/12 0648    Specimen Information:Blood / Blood Updated:12/28/12 0704     WBC 11.05 (H)      RBC 4.89      Hgb 13.8 g/dL      Hematocrit 01.0 %      MCV 87.1 fL      MCH 28.2 pg      MCHC 32.4 g/dL      RDW 14 %      Platelets 313      MPV  10.0 fL      Neutrophils 65 %      Lymphocytes Automated 24 %      Monocytes 9 %      Eosinophils Automated 2 %      Basophils Automated 0 %      Immature Granulocyte 0 %      Neutrophils Absolute 7.21      Abs Lymph Automated 2.66      Abs Mono Automated 0.99      Abs Eos Automated 0.17      Absolute Baso Automated 0.02      Absolute Immature Granulocyte 0.02           Radiology Results (24 Hour)     ** No Results found for the last 24 hours. **                  Procedures    Clinical Impression & Disposition     Clinical Impression  Final diagnoses:   Cellulitis of face   Diabetes        ED Disposition     Admit            New Prescriptions    No medications on file               Helayne Seminole, MD  12/28/12 [phone]

## 2012-12-29 LAB — COMPREHENSIVE METABOLIC PANEL
ALT: 37 U/L (ref 0–55)
AST (SGOT): 17 U/L (ref 5–34)
Albumin/Globulin Ratio: 1.3 (ref 0.9–2.2)
Albumin: 3.6 g/dL (ref 3.5–5.0)
Alkaline Phosphatase: 91 U/L (ref 40–150)
Anion Gap: 8 (ref 5.0–15.0)
BUN: 18.3 mg/dL (ref 9.0–21.0)
Bilirubin, Total: 0.5 mg/dL (ref 0.2–1.2)
CO2: 30 — ABNORMAL HIGH (ref 22–29)
Calcium: 9.4 mg/dL (ref 8.5–10.5)
Chloride: 105 (ref 98–107)
Creatinine: 1.1 mg/dL (ref 0.7–1.3)
Globulin: 2.7 g/dL (ref 2.0–3.6)
Glucose: 111 mg/dL — ABNORMAL HIGH (ref 70–100)
Potassium: 4 (ref 3.5–5.1)
Protein, Total: 6.3 g/dL (ref 6.0–8.3)
Sodium: 143 (ref 136–145)

## 2012-12-29 LAB — CBC AND DIFFERENTIAL
Basophils Absolute Automated: 0.02 (ref 0.00–0.20)
Basophils Automated: 0 %
Eosinophils Absolute Automated: 0.22 (ref 0.00–0.70)
Eosinophils Automated: 2 %
Hematocrit: 40.1 % — ABNORMAL LOW (ref 42.0–52.0)
Hgb: 13.2 g/dL (ref 13.0–17.0)
Immature Granulocytes Absolute: 0.04
Immature Granulocytes: 0 %
Lymphocytes Absolute Automated: 3 (ref 0.50–4.40)
Lymphocytes Automated: 26 %
MCH: 28.6 pg (ref 28.0–32.0)
MCHC: 32.9 g/dL (ref 32.0–36.0)
MCV: 86.8 fL (ref 80.0–100.0)
MPV: 9.8 fL (ref 9.4–12.3)
Monocytes Absolute Automated: 0.98 (ref 0.00–1.20)
Monocytes: 8 %
Neutrophils Absolute: 7.4 (ref 1.80–8.10)
Neutrophils: 64 %
Platelets: 281 (ref 140–400)
RBC: 4.62 — ABNORMAL LOW (ref 4.70–6.00)
RDW: 15 % (ref 12–15)
WBC: 11.62 — ABNORMAL HIGH (ref 3.50–10.80)

## 2012-12-29 LAB — GFR: EGFR: >60

## 2012-12-29 LAB — POCT GLUCOSE
Whole Blood Glucose POCT: 100 mg/dL (ref 70–100)
Whole Blood Glucose POCT: 113 mg/dL — AB (ref 70–100)
Whole Blood Glucose POCT: 137 mg/dL — AB (ref 70–100)
Whole Blood Glucose POCT: 117 mg/dL — AB (ref 70–100)

## 2012-12-29 LAB — HEMOGLOBIN A1C: Hemoglobin A1C: 6.2 % — ABNORMAL HIGH (ref 0.0–6.0)

## 2012-12-29 LAB — MAGNESIUM: Magnesium: 2.3 mg/dL (ref 1.6–2.6)

## 2012-12-29 NOTE — Progress Notes (Signed)
INFECTIOUS DISEASES PROGRESS NOTE  Richard Lawrence        Date Time: 12/29/2012 2:08 PM  Patient Name: Richard Lawrence    Patient Active Problem List    Diagnosis Date Noted   . Facial cellulitis 12/28/2012   . Leucocytosis 12/28/2012   . HTN (hypertension) 12/28/2012   . Type II or unspecified type diabetes mellitus with neurological manifestations, uncontrolled(250.62) 07/06/2012   . Diabetes mellitus without complication 04/22/2012           Assessment:     Facial cellulitis-failed PO doxy. On empiric vanc and ceftriaxone with minimal improvement so far   Leucocytosis- due to above   HTN (hypertension)   DM II    Plan:   Continue current Abx  Monitor the facial exam  Follow temp, wbc      Subjective:   Richard Lawrence today has, No fevers, No chills, No nightsweats,malaise   No headache, No chest pain,   No abdominal pain - No Nausea, No vomiting, No diarrhea,   No dysuria, No urgency, No frequency,   No Cough - SOB.   Appetite good   No new weakness tingling or numbness,   Feeling better    Antibiotics:   Ceftriaxone  Vancomycin  All other medications reviewed    Lines:     Active PICC Line / CVC Line / PIV Line / Drain / Airway / Intraosseous Line / Epidural Line / ART Line / Line / Wound / Pressure Ulcer / NG/OG Tube     Name   Placement date   Placement time   Site   Days    Peripheral IV 12/29/12 Right Antecubital  12/29/12   1254   Antecubital   less than 1          Physical Exam:   Temp:  [97.2 F (36.2 C)-99.1 F (37.3 C)] 97.4 F (36.3 C)  Heart Rate:  [69-92] 80   Resp Rate:  [16-17] 16   BP: (128-170)/(73-92) 128/73 mmHg    GEN: Awake Alert, Oriented x 3, No new F.N deficits, Normal affect  HEENT: NC.AT,PERRLA, EOMI. MMM. Erythema, with mild induration, warmth, desquamation, minimal TTP right side of nose and face- no real change from yesterday except less TTP. Mild infraorbital erythema left side of face.  Neck:Supple Neck,No JVD, No cervical lymphadenopathy appreciated.   Chest:Symmetrical Chest wall  movement, Good air movement bilaterally, CTAB  CVS: RRR,No Gallops,Rubs or new Murmurs, No Parasternal Heave  Abd:  +ve B.Sounds, Abd Soft, Non tender, No organomegaly appreciated, No rebound  -guarding or rigidity.  Ext:  No Cyanosis, Clubbing or edema, No new Rash or bruise  Neuro: Grossly non focal          Labs:     Results     Procedure Component Value Units Date/Time    POCT glucose (AC and HS) [[id number]]  (Abnormal) Collected:12/29/12 1126     POCT Glucose WB 113 (A) mg/dL GNFAOZH:08/65/78 4696    POCT glucose (AC and HS) [[id number]] Collected:12/29/12 0726     POCT Glucose WB 100 mg/dL GMWNUUV:25/36/64 4034    Magnesium [[id number]] Collected:12/29/12 0628    Specimen Information:Blood Updated:12/29/12 0717     Magnesium 2.3 mg/dL     Comprehensive metabolic panel [[id number]]  (Abnormal) Collected:12/29/12 0628    Specimen Information:Blood Updated:12/29/12 0717     Glucose 111 (H) mg/dL      BUN 18.8 mg/dL      Creatinine 1.1 mg/dL      Sodium 416  Potassium 4.0      Chloride 105      CO2 30 (H)      CALCIUM 9.4 mg/dL      Protein, Total 6.3 g/dL      Albumin 3.6 g/dL      AST (SGOT) 17 U/L      ALT 37 U/L      Alkaline Phosphatase 91 U/L      Bilirubin, Total 0.5 mg/dL      Globulin 2.7 g/dL      Albumin/Globulin Ratio 1.3      Anion Gap 8.0     GFR [[id number]] Collected:12/29/12 0628     EGFR >60.0 Updated:12/29/12 0717    CBC and differential [[id number]]  (Abnormal) Collected:12/29/12 0628    Specimen Information:Blood / Blood Updated:12/29/12 0649     WBC 11.62 (H)      RBC 4.62 (L)      Hgb 13.2 g/dL      Hematocrit 64.4 (L) %      MCV 86.8 fL      MCH 28.6 pg      MCHC 32.9 g/dL      RDW 15 %      Platelets 281      MPV 9.8 fL      Neutrophils 64 %      Lymphocytes Automated 26 %      Monocytes 8 %      Eosinophils Automated 2 %      Basophils Automated 0 %      Immature Granulocyte 0 %      Neutrophils Absolute 7.40      Abs Lymph Automated 3.00      Abs Mono Automated 0.98      Abs Eos Automated  0.22      Absolute Baso Automated 0.02      Absolute Immature Granulocyte 0.04     POCT glucose (AC and HS) [[id number]] Collected:12/28/12 2210     POCT Glucose WB 87 mg/dL GLOVFIE:33/29/51 8841    POCT glucose (AC and HS) [[id number]] Collected:12/28/12 1713     POCT Glucose WB 97 mg/dL FUXNATF:57/32/20 2542          Rads:     Radiology Results (24 Hour)     ** No Results found for the last 24 hours. **            Signed by: Richard Mody, MD

## 2012-12-29 NOTE — Progress Notes (Addendum)
[facility] Hospitalist Daily Progress Note        Date Time: 12/29/2012  2:27 PM  Patient Name:Richard Lawrence  UYQ:[id number]  PCP: Royal Piedra, MD  Attending Physician:Haris Baack S Margee Trentham M.D.      Chief Complaint:      Chief Complaint   Patient presents with   . Facial Redness        Subjective:   Feels nose not as swollen before, tenderness over nose improved, redness a little better.   Assessment/Plan     Active Diagnosis: Principal Problem:   *Facial cellulitis  Active Problems:   Diabetes mellitus without complication   Leucocytosis   HTN (hypertension)    1. Facial cellulitis :   Afebrile, leucocytosis at 11. Continue IV Rocephin and Vancomycin as per ID . Wound cx pending   2 DM type 2 HbA1c pending continue SSC. Reports last hba1c at 6.2   3 HTN stable, continue antihypertensive therapy   4. Leucocytosis sec to #1 monitor   DVT Prohylaxis: Lovenox  Code Status: No Order   Disposition: home when stable  Prognosis: fair  Type of Admission:Observation  Estimated Length of Stay (including stay in the ER receiving treatment): 2 days  Medical Necessity for stay: facial cellulitis     Allergies:     Allergies   Allergen Reactions   . Erythromycin Diarrhea   . Levaquin (Levofloxacin Hemihydrate) Angioedema   . Lipitor (Atorvastatin Calcium)    . Other      Molds and dust   . Penicillins Hives and Rash       Physical Exam:    height is 1.778 m (5\' 10" ) and weight is 116.574 kg (257 lb). His temporal artery temperature is 97.4 F (36.3 C). His blood pressure is 128/73 and his pulse is 80. His respiration is 16 and oxygen saturation is 98%.   Body mass index is 36.88 kg/(m^2).  Filed Vitals:    12/29/12 0106 12/29/12 0502 12/29/12 0843 12/29/12 1349   BP: 168/88 142/78 160/92 128/73   Pulse: 76 69 82 80   Temp: 97.6 F (36.4 C) 97.2 F (36.2 C) 97.4 F (36.3 C) 97.4 F (36.3 C)   TempSrc: Temporal Artery Temporal Artery Temporal Artery Temporal Artery   Resp: 17 17 16 16     Height:       Weight:       SpO2: 100% 99% 98% 98%     Intake and Output Summary (Last 24 hours) at Date Time    Intake/Output Summary (Last 24 hours) at 12/29/12 1427  Last data filed at 12/29/12 1349   Gross per 24 hour   Intake   1220 ml   Output    875 ml   Net    345 ml       Constitutional: Patient is oriented to person, place, and time. Patient appears well-developed and well-nourished.   Head: Normocephalic and atraumatic.  Face: Erythema right upper cheek lower lid extending to nasal bridge and mild erthyema over left cheek   Mouth - mucous membranes moist  Neck: Normal range of motion. Neck supple\. No thyromegaly present.   Cardiovascular: Normal rate, regular rhythm, normal heart sounds and intact distal pulses.  Exam reveals no gallop and no friction rub. No murmur heard.  Pulmonary/Chest: Effort normal and breath sounds normal. No stridor. No respiratory distress. Patient has no wheezes. No rales were present.  Exhibits no tenderness.   Abdominal: Soft. Bowel sounds are normal. Patient exhibits no distension  and no mass was palpable. There is no tenderness. There is no rebound and no guarding.   Musculoskeletal: Normal range of motion. Patient exhibits no edema and no tenderness.   Lymphadenopathy:  Patient has no cervical adenopathy.   Neurological: Patient is alert and oriented to person, place, and time and has normal reflexes. No cranial nerve deficit.  Normal muscle tone. Coordination normal.   Skin: Skin is warm. No rash noted. Patient is not diaphoretic. No erythema. No pallor.   Psychiatric: Has normal mood and affect. Behavior is normal. Judgment and thought content normal.    Consult Input/Plan     Plan  None    Medications:     Current Facility-Administered Medications   Medication Dose Route Frequency Last Rate Last Dose   . acetaminophen (TYLENOL) tablet 650 mg  650 mg Oral Q4H PRN       . carvedilol (COREG) tablet 12.5 mg  12.5 mg Oral QAM W/BREAKFAST   12.5 mg at 12/29/12 0845   .  carvedilol (COREG) tablet 6.25 mg  6.25 mg Oral QD after dinner   6.25 mg at 12/28/12 1752   . cefTRIAXone (ROCEPHIN) 1 g in sodium chloride 0.9 % 50 mL IVPB mini-bag plus  1 g Intravenous Q24H SCH 100 mL/hr at 12/29/12 0550 1 g at 12/29/12 0550   . dextrose (GLUCOSE) 40 % oral gel 15 g  15 g Oral PRN       . dextrose 50 % bolus 25 mL  25 mL Intravenous PRN       . enoxaparin (LOVENOX) syringe 40 mg  40 mg Subcutaneous Daily       . furosemide (LASIX) tablet 40 mg  40 mg Oral BID   40 mg at 12/29/12 1042   . glucagon (rDNA) (GLUCAGEN) injection 1 mg  1 mg Intramuscular PRN       . insulin aspart (NovoLOG) injection 1-5 Units  1-5 Units Subcutaneous TID AC PRN       . lactobacillus/streoptococcus (RISAQUAD) capsule 1 capsule  1 capsule Oral Daily       . montelukast (SINGULAIR) tablet 10 mg  10 mg Oral QHS   10 mg at 12/28/12 2248   . morphine injection 2 mg  2 mg Intravenous Q4H PRN       . ondansetron (ZOFRAN) injection 4 mg  4 mg Intravenous Q6H PRN       . vancomycin (VANCOCIN) 1,750 mg in sodium chloride 0.9 % 500 mL IVPB  1,750 mg Intravenous Q12H 250 mL/hr at 12/29/12 1042 1,750 mg at 12/29/12 1042   . zolpidem (AMBIEN) tablet 5 mg  5 mg Oral QHS PRN         Review of Systems:   A comprehensive review of systems has no changes since H&P was obtained except as mentioned in the subjective section.    Labs:     Results     Procedure Component Value Units Date/Time    POCT glucose (AC and HS) [[id number]]  (Abnormal) Collected:12/29/12 1126     POCT Glucose WB 113 (A) mg/dL NWGNFAO:13/08/65 7846    POCT glucose (AC and HS) [[id number]] Collected:12/29/12 0726     POCT Glucose WB 100 mg/dL LKGMWNU:27/25/36 6440    Magnesium [[id number]] Collected:12/29/12 0628    Specimen Information:Blood Updated:12/29/12 0717     Magnesium 2.3 mg/dL     Comprehensive metabolic panel [[id number]]  (Abnormal) Collected:12/29/12 0628    Specimen Information:Blood Updated:12/29/12 0717     Glucose 111 (H)  mg/dL      BUN 16.1 mg/dL       Creatinine 1.1 mg/dL      Sodium 096      Potassium 4.0      Chloride 105      CO2 30 (H)      CALCIUM 9.4 mg/dL      Protein, Total 6.3 g/dL      Albumin 3.6 g/dL      AST (SGOT) 17 U/L      ALT 37 U/L      Alkaline Phosphatase 91 U/L      Bilirubin, Total 0.5 mg/dL      Globulin 2.7 g/dL      Albumin/Globulin Ratio 1.3      Anion Gap 8.0     GFR [[id number]] Collected:12/29/12 0628     EGFR >60.0 Updated:12/29/12 0717    CBC and differential [[id number]]  (Abnormal) Collected:12/29/12 0628    Specimen Information:Blood / Blood Updated:12/29/12 0649     WBC 11.62 (H)      RBC 4.62 (L)      Hgb 13.2 g/dL      Hematocrit 21.3 (L) %      MCV 86.8 fL      MCH 28.6 pg      MCHC 32.9 g/dL      RDW 15 %      Platelets 281      MPV 9.8 fL      Neutrophils 64 %      Lymphocytes Automated 26 %      Monocytes 8 %      Eosinophils Automated 2 %      Basophils Automated 0 %      Immature Granulocyte 0 %      Neutrophils Absolute 7.40      Abs Lymph Automated 3.00      Abs Mono Automated 0.98      Abs Eos Automated 0.22      Absolute Baso Automated 0.02      Absolute Immature Granulocyte 0.04     POCT glucose (AC and HS) [[id number]] Collected:12/28/12 2210     POCT Glucose WB 87 mg/dL GEXBMWU:13/24/40 1027    POCT glucose (AC and HS) [[id number]] Collected:12/28/12 1713     POCT Glucose WB 97 mg/dL KVQQVZD:63/87/56 4332          Rads:   Radiological Procedure reviewed.  Radiology Results (24 Hour)     ** No Results found for the last 24 hours. **            Time spent for evaluation, management and coordination of care:   35 minutes          Signed by: Simmie Davies Sedalia Greeson  12/29/2012 2:27 PM

## 2012-12-30 LAB — CBC AND DIFFERENTIAL
Basophils Absolute Automated: 0.03 (ref 0.00–0.20)
Basophils Automated: 0 %
Eosinophils Absolute Automated: 0.18 (ref 0.00–0.70)
Eosinophils Automated: 2 %
Hematocrit: 42 % (ref 42.0–52.0)
Hgb: 13.6 g/dL (ref 13.0–17.0)
Immature Granulocytes Absolute: 0.03
Immature Granulocytes: 0 %
Lymphocytes Absolute Automated: 2.24 (ref 0.50–4.40)
Lymphocytes Automated: 20 %
MCH: 28.3 pg (ref 28.0–32.0)
MCHC: 32.4 g/dL (ref 32.0–36.0)
MCV: 87.3 fL (ref 80.0–100.0)
MPV: 10 fL (ref 9.4–12.3)
Monocytes Absolute Automated: 0.82 (ref 0.00–1.20)
Monocytes: 8 %
Neutrophils Absolute: 7.64 (ref 1.80–8.10)
Neutrophils: 70 %
Platelets: 302 (ref 140–400)
RBC: 4.81 (ref 4.70–6.00)
RDW: 14 % (ref 12–15)
WBC: 10.91 — ABNORMAL HIGH (ref 3.50–10.80)

## 2012-12-30 LAB — VANCOMYCIN, TROUGH: Vancomycin Trough: 19.3 ug/mL (ref 10.0–20.0)

## 2012-12-30 LAB — POCT GLUCOSE
Whole Blood Glucose POCT: 107 mg/dL — AB (ref 70–100)
Whole Blood Glucose POCT: 112 mg/dL — AB (ref 70–100)
Whole Blood Glucose POCT: 86 mg/dL (ref 70–100)
Whole Blood Glucose POCT: 160 mg/dL — AB (ref 70–100)

## 2012-12-30 LAB — BASIC METABOLIC PANEL
Anion Gap: 10 (ref 5.0–15.0)
BUN: 16 mg/dL (ref 9.0–21.0)
CO2: 29 (ref 22–29)
Calcium: 9.3 mg/dL (ref 8.5–10.5)
Chloride: 101 (ref 98–107)
Creatinine: 1.1 mg/dL (ref 0.7–1.3)
Glucose: 158 mg/dL — ABNORMAL HIGH (ref 70–100)
Potassium: 3.9 (ref 3.5–5.1)
Sodium: 140 (ref 136–145)

## 2012-12-30 LAB — GFR: EGFR: >60

## 2012-12-30 MED ORDER — AQUAPHOR EX OINT
TOPICAL_OINTMENT | CUTANEOUS | Status: DC | PRN
Start: 2012-12-30 — End: 2012-12-31
  Filled 2012-12-30: qty 99

## 2012-12-30 MED ORDER — VANCOMYCIN HCL 1000 MG IV SOLR
1500.0000 mg | Freq: Two times a day (BID) | INTRAVENOUS | Status: DC
Start: 2012-12-30 — End: 2012-12-31
  Administered 2012-12-30 – 2012-12-31 (×2): 1500 mg via INTRAVENOUS
  Filled 2012-12-30 (×3): qty 1500

## 2012-12-30 MED ORDER — FUROSEMIDE 80 MG PO TABS
80.0000 mg | ORAL_TABLET | Freq: Every day | ORAL | Status: DC
Start: 2012-12-31 — End: 2012-12-31
  Administered 2012-12-31: 80 mg via ORAL
  Filled 2012-12-30: qty 1

## 2012-12-30 MED ORDER — FUROSEMIDE 40 MG PO TABS
40.0000 mg | ORAL_TABLET | Freq: Once | ORAL | Status: AC
Start: 2012-12-30 — End: 2012-12-30
  Administered 2012-12-30: 40 mg via ORAL

## 2012-12-30 NOTE — Progress Notes (Signed)
[facility] Case Management Initial Discharge Planning Assessment     Psychosocial/Demographic information   Who was interviewed, relationship, best contact information - CM attempted to speak with patient multiple times -- was out ambulating, on phone, with RN.  Will follow up with patient as able.  Spoke with RN Annabelle Harman and she was able to provide info on pt.    Cognitive status: alert and oriented  Pt lives with:  spouse  Type of residence: home  Prior level of functioning:  independent  Lawyer system:   Insurance status, co-pays, medication coverage: BC/BS FEP  Any additional emergency contacts? Spouse  BOWIE, DELIA   [phone]   [phone]   [phone]       DME, SNF, Home Care companies   DME currently at home:   Has the patient been to a SNF in the past? If so, where?:   Any home care companies -   Palliative care or hospice involvement -         Advanced directives on the chart?   Healthcare Decision Maker and relationship to patient -    PCP - Royal Piedra, MD   TCM referral needed? no  D/C Clinic appointment needed?  Date, time and Location- no  CM offered PCP follow up appointment setup within 72 hrs of discharge.   Appointment Date and time-       Discharge Needs: Pending.  Patient is completely independent and ambulatory per RN.  Per Dr. Bryn Gulling, plan home with po abx unless no improvement, in which case he may possibly need iv abx at home.     Potential Barriers to Discharge:     Discussed Anticipated Discharge Date and Discharge Disposition Possibilities with: RN, MD. Pt not available.    Pt goals/preferences:    Anticipation of care needs increasing or decreasing over time?  Decreasing      CM unable to speak with patient today.  CM will follow for possible needs at discharge.

## 2012-12-30 NOTE — Progress Notes (Signed)
INFECTIOUS DISEASES PROGRESS NOTE  Lambert Mody        Date Time: 12/30/2012 12:45 PM  Patient Name: Richard Lawrence,Richard Lawrence    Patient Active Problem List    Diagnosis Date Noted   . Facial cellulitis 12/28/2012   . Leucocytosis 12/28/2012   . HTN (hypertension) 12/28/2012   . Type II or unspecified type diabetes mellitus with neurological manifestations, uncontrolled(250.62) 07/06/2012   . Diabetes mellitus without complication 04/22/2012           Assessment:     Facial cellulitis-failed PO doxy. On empiric vanc and ceftriaxone with slow improvement so far   Leucocytosis- due to above   HTN (hypertension)   DM II    Plan:   Continue current Abx  Monitor the facial exam  Follow temp, wbc      Subjective:   [name] today has, No fevers, No chills, No nightsweats,malaise   No headache, No chest pain,   No abdominal pain - No Nausea, No vomiting, No diarrhea,   No dysuria, No urgency, No frequency,   No Cough - SOB.   Appetite good   No new weakness tingling or numbness,   Feeling better    Antibiotics:   Ceftriaxone  Vancomycin  All other medications reviewed    Lines:     Active PICC Line / CVC Line / PIV Line / Drain / Airway / Intraosseous Line / Epidural Line / ART Line / Line / Wound / Pressure Ulcer / NG/OG Tube     Name   Placement date   Placement time   Site   Days    Peripheral IV 12/29/12 Right Antecubital  12/29/12   1254   Antecubital   less than 1          Physical Exam:   Temp:  [96.9 F (36.1 C)-98.2 F (36.8 C)] 97.7 F (36.5 C)  Heart Rate:  [73-90] 79   Resp Rate:  [16-18] 18   BP: (119-184)/(73-98) 129/75 mmHg    GEN: Awake Alert, Oriented x 3, No new F.N deficits, Normal affect  HEENT: NC.AT,PERRLA, EOMI. MMM.Less intnse erythema, less swelling, with mild induration, warmth, desquamation, noTTP right side of nose and face-. Mild infraorbital erythema left side of face lss red.  Neck:Supple Neck,No JVD, No cervical lymphadenopathy appreciated.   Chest:Symmetrical Chest wall movement, Good air  movement bilaterally, CTAB  CVS: RRR,No Gallops,Rubs or new Murmurs, No Parasternal Heave  Abd:  +ve B.Sounds, Abd Soft, Non tender, No organomegaly appreciated, No rebound  -guarding or rigidity.  Ext:  No Cyanosis, Clubbing or edema, No new Rash or bruise  Neuro: Grossly non focal          Labs:     Results     Procedure Component Value Units Date/Time    POCT glucose (AC and HS) [[id number]]  (Abnormal) Collected:12/30/12 1218     POCT Glucose WB 112 (A) mg/dL WUJWJXB:14/78/29 5621    Vancomycin, trough [[id number]] Collected:12/30/12 1018    Specimen Information:Blood Updated:12/30/12 1135     Vancomycin Trough 19.3 ug/mL      Vancomycin Date of Last Dose 12/29/2012     Basic Metabolic Panel [[id number]]  (Abnormal) Collected:12/30/12 1018    Specimen Information:Blood Updated:12/30/12 1057     Glucose 158 (H) mg/dL      BUN 32.4 mg/dL      Creatinine 1.1 mg/dL      CALCIUM 9.3 mg/dL      Sodium 401  Potassium 3.9      Chloride 101      CO2 29      Anion Gap 10.0     GFR [[id number]] Collected:12/30/12 1018     EGFR >60.0 Updated:12/30/12 1057    CBC and differential [[id number]]  (Abnormal) Collected:12/30/12 1018    Specimen Information:Blood / Blood Updated:12/30/12 1039     WBC 10.91 (H)      RBC 4.81      Hgb 13.6 g/dL      Hematocrit 13.2 %      MCV 87.3 fL      MCH 28.3 pg      MCHC 32.4 g/dL      RDW 14 %      Platelets 302      MPV 10.0 fL      Neutrophils 70 %      Lymphocytes Automated 20 %      Monocytes 8 %      Eosinophils Automated 2 %      Basophils Automated 0 %      Immature Granulocyte 0 %      Neutrophils Absolute 7.64      Abs Lymph Automated 2.24      Abs Mono Automated 0.82      Abs Eos Automated 0.18      Absolute Baso Automated 0.03      Absolute Immature Granulocyte 0.03     POCT glucose (AC and HS) [[id number]]  (Abnormal) Collected:12/30/12 0726     POCT Glucose WB 107 (A) mg/dL DGUYQIH:47/42/59 5638    Hemoglobin A1C [[id number]]  (Abnormal) Collected:12/29/12 0626    Specimen  Information:Blood Updated:12/29/12 2224     Hemoglobin A1C 6.2 (H) %     POCT glucose (AC and HS) [[id number]]  (Abnormal) Collected:12/29/12 2154     POCT Glucose WB 117 (A) mg/dL TKZSWFU:93/23/55 7322    POCT glucose (AC and HS) [[id number]]  (Abnormal) Collected:12/29/12 1712     POCT Glucose WB 137 (A) mg/dL BJSEGBT:51/76/16 0737          Rads:     Radiology Results (24 Hour)     ** No Results found for the last 24 hours. **            Signed by: Lambert Mody, MD

## 2012-12-30 NOTE — Consults (Signed)
Service Date: 12/28/2012     Patient Type: I     CONSULTING PHYSICIAN: Lambert Mody MD     REFERRING PHYSICIAN: Mary Sella MD     REASON FOR CONSULTATION:  Facial cellulitis.     HISTORY OF PRESENT ILLNESS:  This is a 62 year old gentleman with past medical history significant for  type 2 diabetes, sleep apnea, hypertension, hyperlipidemia, asthma, CHF,  and herniated disk in the lumbar region, who presented to the emergency  room with progressively worsening facial redness, pain, and swelling.  The  patient stated that he noted some redness in the area of the tip of the  nose and the right side of the nose with mild pain and tenderness to  palpation, which progressively worsened over the next 2 days.  He was seen  at an urgent care and was prescribed doxycycline but these symptoms  continued to worsen and the redness, warmth, swelling spread over the left  side of his face.  He denies any fevers, but he did have chills and he came  into the ER.  He denies any headache, eye pain, visual changes, diplopia,  runny nose, or postnasal drainage.  No sinus pain or pressure or any  lesions on his face, any pruritus, any sore throat, cough, chest pain,  shortness of breath, nausea, vomiting, belly pain, diarrhea, any GU  problems, arthralgias or myalgias, other rashes or joint pain or swelling.   He denies any ill contacts.  He denies any unusual exposures, although he  does use chemicals, but he has not had any contact with the chemicals on  his face.  Does get angioedema in his ears, sometimes on his face, but that  is never tender and is not red like this.  He denies any insect bites.     PAST MEDICAL HISTORY:  1.  Type 2 diabetes.  2.  Sleep apnea, unable to tolerate mask or nose pillow.  3.  Essential hypertension.  4.  Hyperlipidemia.  5.  Asthma.  6.  Congestive heart failure.  7.  Herniated disk L4-L5.     PAST SURGICAL HISTORY:  No personal past surgical history.     FAMILY HISTORY:  Significant for diabetes,  cancer, and hypertension.     SOCIAL HISTORY:  He is married.  Denies tobacco, alcohol, or recreational drug use.     ALLERGIES:  PENICILLIN which gives him hives and rash, LEVAQUIN, give him angioedema.   ERYTHROMYCIN gives him diarrhea.  LIPITOR, MOLD and DUST.     MEDICATIONS:  As an outpatient, he was on carvedilol, Lasix, Claritin, Glucophage,  Singulair, Proventil inhaler and doxycycline.     REVIEW OF SYSTEMS:  As per HPI.  All other review of systems negative.     PHYSICAL EXAMINATION:  VITAL SIGNS:  T-max, T-current of 98.4, heart rate is 72, blood pressure  141/96, saturating 93% on room air.  GENERAL:  Well-developed, middle-aged gentleman sitting up in bed in no  acute distress.  HEENT:  Normocephalic, atraumatic.  EOMI.  PERRLA.  Sclerae anicteric.   Conjunctivae pink.  Mucous membranes moist.  There is erythema, warmth,  mild induration on his nose on the right side, spreading over his cheek  under his eye.  With minimal erythema over to the left side.  No vesicles  or pustules noted.  There is mild tenderness to palpation and the sinuses  nontender, no oral lesions.  Oropharynx clear.  NECK:  Supple, no adenopathy.  CHEST:  Clear  to auscultation bilaterally.  CVS:  S1, S2, regular rate and rhythm, no murmurs, rubs, or gallops.  ABDOMEN:  Soft, nontender, nondistended, normoactive bowel sounds.  EXTREMITIES:  No clubbing, cyanosis, or edema.  Distal pulses 1+.  NEUROLOGIC:  Grossly nonfocal, no other skin  lesions or rashes.  Joints  benign.  No CVA tenderness.     LABORATORY DATA:  The patient has a white count of 11.05, H and H of 13.8 and 42.6 with 313  platelets, 55% granulocytes, 24% lymphocytes.  Glucose of 129.  BUN and  creatinine of 21.2 and 1.1.  Sodium 142, potassium 4.0, chloride 102, CO2  is 30, calcium 9.4, albumin 4.0.  LFTs within normal limits.  CRP is 0.5.     IMPRESSION:  1.  Facial cellulitis.  The patient failed outpatient oral doxycycline.  2.  Diabetes.  3.  Leukocytosis  secondary to facial cellulitis.  4.  Hypertension.  5.  Sleep apnea.  6.  History of asthma.  7.  Hyperlipidemia.  8.  History of congestive heart failure.     RECOMMENDATIONS:  1.  Vancomycin and ceftriaxone empirically.    2.  Follow exam.  Temperature and white count.    3. Supportive care.  4.  Will tailor antibiotics according to the patient's clinical course and  culture results.     Thank you for allowing me to participate in the care of this patient.  We  will follow with you.           D:  12/29/2012 21:27 PM by Dr. Lambert Mody, MD ([id number])  T:  12/30/2012 07:26 AM by Crissie Sickles      Everlean Cherry: [id number]) (Doc ID: [id number])

## 2012-12-30 NOTE — Progress Notes (Signed)
[facility] Hospitalist Daily Progress Note        Date Time: 12/30/2012  4:11 PM  Patient Name:Richard Lawrence  VWU:[id number]  PCP: Royal Piedra, MD  Attending Physician:Mickel Schreur S Child Campoy M.D.      Chief Complaint:      Chief Complaint   Patient presents with   . Facial Redness        Subjective:   Redness a little better. Itching over face, with dry scaling face and arms   Assessment/Plan     Active Diagnosis: Principal Problem:   *Facial cellulitis  Active Problems:   Diabetes mellitus without complication   Leucocytosis   HTN (hypertension)    1. Facial cellulitis :   Afebrile, leucocytosis improved. Continue IV Rocephin and Vancomycin as per ID . No new recommendation, slow to respond to abx.   2 DM type 2 HbA1c at 6.2 continue SSC.   3 HTN stable, continue antihypertensive therapy   4. Leucocytosis sec to #1 monitor   5 Dry skin start aquaphor  DVT Prohylaxis: Lovenox  Code Status: No Order   Disposition: home when stable  Prognosis: fair  Type of Admission:Inpatient  Estimated Length of Stay (including stay in the ER receiving treatment): 1-2 days  Medical Necessity for stay: facial cellulitis     Allergies:     Allergies   Allergen Reactions   . Erythromycin Diarrhea   . Levaquin (Levofloxacin Hemihydrate) Angioedema   . Lipitor (Atorvastatin Calcium)    . Other      Molds and dust   . Penicillins Hives and Rash       Physical Exam:    height is 1.778 m (5\' 10" ) and weight is 116.574 kg (257 lb). His temporal artery temperature is 97.2 F (36.2 C). His blood pressure is 143/80 and his pulse is 83. His respiration is 18 and oxygen saturation is 95%.   Body mass index is 36.88 kg/(m^2).  Filed Vitals:    12/29/12 2316 12/30/12 0527 12/30/12 1115 12/30/12 1439   BP: 157/83 162/90 129/75 143/80   Pulse: 82 73 79 83   Temp: 97.9 F (36.6 C) 96.9 F (36.1 C) 97.7 F (36.5 C) 97.2 F (36.2 C)   TempSrc: Temporal Artery Temporal Artery Temporal Artery Temporal Artery   Resp:  18 18 18 18    Height:       Weight:       SpO2: 98% 100% 96% 95%     Intake and Output Summary (Last 24 hours) at Date Time    Intake/Output Summary (Last 24 hours) at 12/30/12 1611  Last data filed at 12/30/12 1218   Gross per 24 hour   Intake    980 ml   Output    900 ml   Net     80 ml       Constitutional: Patient is oriented to person, place, and time. Patient appears well-developed and well-nourished.   Head: Normocephalic and atraumatic.  Face: Erythema right upper cheek lower lid extending to nasal bridge and mild erthyema over left cheek   Mouth - mucous membranes moist  Neck: Normal range of motion. Neck supple\. No thyromegaly present.   Cardiovascular: Normal rate, regular rhythm, normal heart sounds and intact distal pulses.  Exam reveals no gallop and no friction rub. No murmur heard.  Pulmonary/Chest: Effort normal and breath sounds normal. No stridor. No respiratory distress. Patient has no wheezes. No rales were present.  Exhibits no tenderness.   Abdominal: Soft. Bowel  sounds are normal. Patient exhibits no distension and no mass was palpable. There is no tenderness. There is no rebound and no guarding.   Musculoskeletal: Normal range of motion. Patient exhibits no edema and no tenderness.   Lymphadenopathy:  Patient has no cervical adenopathy.   Neurological: Patient is alert and oriented to person, place, and time and has normal reflexes. No cranial nerve deficit.  Normal muscle tone. Coordination normal.   Skin: Skin is warm. No rash noted. Patient is not diaphoretic. No erythema. No pallor.   Psychiatric: Has normal mood and affect. Behavior is normal. Judgment and thought content normal.    Consult Input/Plan     Plan  None    Medications:     Current Facility-Administered Medications   Medication Dose Route Frequency Last Rate Last Dose   . acetaminophen (TYLENOL) tablet 650 mg  650 mg Oral Q4H PRN       . carvedilol (COREG) tablet 12.5 mg  12.5 mg Oral QAM W/BREAKFAST   12.5 mg at 12/30/12  0912   . carvedilol (COREG) tablet 6.25 mg  6.25 mg Oral QD after dinner   6.25 mg at 12/29/12 1646   . cefTRIAXone (ROCEPHIN) 1 g in sodium chloride 0.9 % 50 mL IVPB mini-bag plus  1 g Intravenous Q24H SCH 100 mL/hr at 12/30/12 0912 1 g at 12/30/12 0912   . dextrose (GLUCOSE) 40 % oral gel 15 g  15 g Oral PRN       . dextrose 50 % bolus 25 mL  25 mL Intravenous PRN       . enoxaparin (LOVENOX) syringe 40 mg  40 mg Subcutaneous Daily       . [COMPLETED] furosemide (LASIX) tablet 40 mg  40 mg Oral Once   40 mg at 12/30/12 1504   . [START ON 12/31/2012] furosemide (LASIX) tablet 80 mg  80 mg Oral Daily       . glucagon (rDNA) (GLUCAGEN) injection 1 mg  1 mg Intramuscular PRN       . insulin aspart (NovoLOG) injection 1-5 Units  1-5 Units Subcutaneous TID AC PRN       . lactobacillus/streoptococcus (RISAQUAD) capsule 1 capsule  1 capsule Oral Daily       . montelukast (SINGULAIR) tablet 10 mg  10 mg Oral QHS   10 mg at 12/29/12 2254   . morphine injection 2 mg  2 mg Intravenous Q4H PRN       . ondansetron (ZOFRAN) injection 4 mg  4 mg Intravenous Q6H PRN       . petrolatum (AQUAPHOR) ointment   Topical PRN       . vancomycin (VANCOCIN) 1,500 mg in sodium chloride 0.9 % 500 mL IVPB  1,500 mg Intravenous Q12H       . zolpidem (AMBIEN) tablet 5 mg  5 mg Oral QHS PRN       . [DISCONTINUED] furosemide (LASIX) tablet 40 mg  40 mg Oral BID   40 mg at 12/30/12 0912   . [DISCONTINUED] vancomycin (VANCOCIN) 1,750 mg in sodium chloride 0.9 % 500 mL IVPB  1,750 mg Intravenous Q12H 250 mL/hr at 12/30/12 1057 1,750 mg at 12/30/12 1057     Review of Systems:   A comprehensive review of systems has no changes since H&P was obtained except as mentioned in the subjective section.    Labs:     Results     Procedure Component Value Units Date/Time    Vancomycin, trough [[id number]]  Collected:12/30/12 1018    Specimen Information:Blood Updated:12/30/12 1301     Vancomycin Trough 19.3 ug/mL      Vancomycin Time of Last Dose UNKNOWN       Vancomycin Date of Last Dose 12/29/2012     POCT glucose (AC and HS) [[id number]]  (Abnormal) Collected:12/30/12 1218     POCT Glucose WB 112 (A) mg/dL WGNFAOZ:30/86/57 8469    Basic Metabolic Panel [[id number]]  (Abnormal) Collected:12/30/12 1018    Specimen Information:Blood Updated:12/30/12 1057     Glucose 158 (H) mg/dL      BUN 24.4 mg/dL      Creatinine 1.1 mg/dL      CALCIUM 9.3 mg/dL      Sodium 010      Potassium 3.9      Chloride 101      CO2 29      Anion Gap 10.0     GFR [[id number]] Collected:12/30/12 1018     EGFR >60.0 Updated:12/30/12 1057    CBC and differential [[id number]]  (Abnormal) Collected:12/30/12 1018    Specimen Information:Blood / Blood Updated:12/30/12 1039     WBC 10.91 (H)      RBC 4.81      Hgb 13.6 g/dL      Hematocrit 63.8 %      MCV 87.3 fL      MCH 28.3 pg      MCHC 32.4 g/dL      RDW 14 %      Platelets 302      MPV 10.0 fL      Neutrophils 70 %      Lymphocytes Automated 20 %      Monocytes 8 %      Eosinophils Automated 2 %      Basophils Automated 0 %      Immature Granulocyte 0 %      Neutrophils Absolute 7.64      Abs Lymph Automated 2.24      Abs Mono Automated 0.82      Abs Eos Automated 0.18      Absolute Baso Automated 0.03      Absolute Immature Granulocyte 0.03     POCT glucose (AC and HS) [[id number]]  (Abnormal) Collected:12/30/12 0726     POCT Glucose WB 107 (A) mg/dL JOACZYS:12/15/14 0109    Hemoglobin A1C [[id number]]  (Abnormal) Collected:12/29/12 0626    Specimen Information:Blood Updated:12/29/12 2224     Hemoglobin A1C 6.2 (H) %     POCT glucose (AC and HS) [[id number]]  (Abnormal) Collected:12/29/12 2154     POCT Glucose WB 117 (A) mg/dL BJSEGBT:51/76/16 0737    POCT glucose (AC and HS) [[id number]]  (Abnormal) Collected:12/29/12 1712     POCT Glucose WB 137 (A) mg/dL IOEVOJJ:00/93/81 8299          Rads:   Radiological Procedure reviewed.  Radiology Results (24 Hour)     ** No Results found for the last 24 hours. **            Time spent for evaluation, management  and coordination of care:   25 minutes          Signed by: Simmie Davies Rhonda Linan  12/30/2012 4:11 PM

## 2012-12-31 LAB — CBC AND DIFFERENTIAL
Basophils Absolute Automated: 0.03 (ref 0.00–0.20)
Basophils Automated: 0 %
Eosinophils Absolute Automated: 0.19 (ref 0.00–0.70)
Eosinophils Automated: 2 %
Hematocrit: 38.2 % — ABNORMAL LOW (ref 42.0–52.0)
Hgb: 12.6 g/dL — ABNORMAL LOW (ref 13.0–17.0)
Lymphocytes Absolute Automated: 3.01 (ref 0.50–4.40)
Lymphocytes Automated: 28 %
MCH: 27.9 pg — ABNORMAL LOW (ref 28.0–32.0)
MCHC: 33 g/dL (ref 32.0–36.0)
MCV: 84.5 fL (ref 80.0–100.0)
MPV: 10.2 fL (ref 9.4–12.3)
Monocytes Absolute Automated: 1.02 (ref 0.00–1.20)
Monocytes: 9 %
Neutrophils Absolute: 6.7 (ref 1.80–8.10)
Neutrophils: 61 %
Platelets: 301 (ref 140–400)
RBC: 4.52 — ABNORMAL LOW (ref 4.70–6.00)
RDW: 14 % (ref 12–15)
WBC: 10.95 — ABNORMAL HIGH (ref 3.50–10.80)

## 2012-12-31 LAB — BASIC METABOLIC PANEL
Anion Gap: 9 (ref 5.0–15.0)
BUN: 13.4 mg/dL (ref 9.0–21.0)
CO2: 27 (ref 22–29)
Calcium: 9.1 mg/dL (ref 8.5–10.5)
Chloride: 104 (ref 98–107)
Creatinine: 1 mg/dL (ref 0.7–1.3)
Glucose: 98 mg/dL (ref 70–100)
Potassium: 3.9 (ref 3.5–5.1)
Sodium: 140 (ref 136–145)

## 2012-12-31 LAB — POCT GLUCOSE
Whole Blood Glucose POCT: 91 mg/dL (ref 70–100)
Whole Blood Glucose POCT: 94 mg/dL (ref 70–100)

## 2012-12-31 LAB — GFR: EGFR: >60

## 2012-12-31 MED ORDER — SULFAMETHOXAZOLE-TMP DS 800-160 MG PO TABS
1.00 | ORAL_TABLET | Freq: Two times a day (BID) | ORAL | Status: DC
Start: 2012-12-31 — End: 2013-10-08

## 2012-12-31 MED ORDER — RISAQUAD PO CAPS
1.0000 | ORAL_CAPSULE | Freq: Every day | ORAL | Status: DC
Start: 2012-12-31 — End: 2013-10-08

## 2012-12-31 MED ORDER — CEPHALEXIN 250 MG PO CAPS
250.0000 mg | ORAL_CAPSULE | Freq: Three times a day (TID) | ORAL | Status: DC
Start: 2012-12-31 — End: 2013-10-08

## 2012-12-31 MED ORDER — AMMONIUM LACTATE 12 % EX LOTN
TOPICAL_LOTION | CUTANEOUS | Status: DC | PRN
Start: 2012-12-31 — End: 2013-10-08

## 2012-12-31 NOTE — Discharge Instructions (Signed)
Cellulitis  You have an infection of the skin known as cellulitis. This usually starts with a scrape, cut, insect bite, blister or other opening in the skin which becomes infected. This is a serious condition. It must be watched closely to be sure the infection is not spreading.  With antibiotic treatment, the size of the red area will gradually shrink in size until the skin returns to normal. This will take 7-10 days.  The red area should never increase in size once the antibiotic medicine has been started. Occasionally, an infection will be resistant to one antibiotic and another one will have to be used.  Home Care:  1) Limit the use of the affected part, since excess movement can cause the infection to spread.  2) If the infection is on your leg, walk as little as possible during the first few days of the treatment. Keep your leg elevated while sitting. This will reduce swelling.  3) Take all of the antibiotic medicine exactly as directed until it is gone. Be careful not to miss any doses, especially during the first seven days.  Follow Up  with your doctor or this facility as directed. Check the infected area daily for the warning signs listed below.  Get Prompt Medical Attention  if any of the following occur:  -- Spreading area of redness  -- Increasing swelling or pain  -- Appearance of pus or drainage  -- Fever over 100.4 F (38.0 C) oral, or over 101.4 F (38.6 C) rectal, after two days on antibiotics   2000-2014 [employer], [address], [place] [address]. All rights reserved. This information is not intended as a substitute for professional medical care. Always follow your healthcare professional's instructions.

## 2012-12-31 NOTE — Progress Notes (Signed)
INFECTIOUS DISEASES PROGRESS NOTE  Lambert Mody        Date Time: 12/31/2012 8:30 AM  Patient Name: Richard Lawrence,Richard Lawrence    Patient Active Problem List    Diagnosis Date Noted   . Facial cellulitis 12/28/2012   . Leucocytosis 12/28/2012   . HTN (hypertension) 12/28/2012   . Type II or unspecified type diabetes mellitus with neurological manifestations, uncontrolled(250.62) 07/06/2012   . Diabetes mellitus without complication 04/22/2012           Assessment:     Facial cellulitis-failed PO doxy. On empiric vanc and ceftriaxone with  improvement    Leucocytosis- due to above   HTN (hypertension)   DM II    Plan:   OK to discharge on oral Bactrim and Ceftin x 7 days  F/U with me in the office in 7-10 days  D/W pt and Dr Mir      Subjective:   [name] today has, No fevers, No chills, No nightsweats,malaise   No headache, No chest pain,   No abdominal pain - No Nausea, No vomiting, No diarrhea,   No dysuria, No urgency, No frequency,   No Cough - SOB.   Appetite good   No new weakness tingling or numbness,   Feeling better    Antibiotics:   Ceftriaxone  Vancomycin  All other medications reviewed    Lines:     Active PICC Line / CVC Line / PIV Line / Drain / Airway / Intraosseous Line / Epidural Line / ART Line / Line / Wound / Pressure Ulcer / NG/OG Tube     Name   Placement date   Placement time   Site   Days    Peripheral IV 12/29/12 Right Antecubital  12/29/12   1254   Antecubital   less than 1          Physical Exam:   Temp:  [96.6 F (35.9 C)-97.7 F (36.5 C)] 96.6 F (35.9 C)  Heart Rate:  [69-83] 69   Resp Rate:  [18] 18   BP: (129-167)/(75-90) 132/81 mmHg    GEN: Awake Alert, Oriented x 3, No new F.N deficits, Normal affect  HEENT: NC.AT,PERRLA, EOMI. MMM.Significantly less intnse erythema, less swelling, with minimal  induration, warmth, desquamation, noTTP right side of nose and face-. Mild infraorbital erythema left side of face less red.  Neck:Supple Neck,No JVD, No cervical lymphadenopathy appreciated.    Chest:Symmetrical Chest wall movement, Good air movement bilaterally, CTAB  CVS: RRR,No Gallops,Rubs or new Murmurs, No Parasternal Heave  Abd:  +ve B.Sounds, Abd Soft, Non tender, No organomegaly appreciated, No rebound  -guarding or rigidity.  Ext:  No Cyanosis, Clubbing or edema, No new Rash or bruise  Neuro: Grossly non focal          Labs:     Results     Procedure Component Value Units Date/Time    POCT glucose (AC and HS) [[id number]] Collected:12/31/12 0754     POCT Glucose WB 91 mg/dL GGYIRSW:54/62/70 3500    Basic Metabolic Panel [[id number]] Collected:12/31/12 0605    Specimen Information:Blood Updated:12/31/12 0732     Glucose 98 mg/dL      BUN 71.6 mg/dL      Creatinine 1.0 mg/dL      CALCIUM 9.1 mg/dL      Sodium 967      Potassium 3.9      Chloride 104      CO2 27      Anion Gap 9.0  GFR [[id number]] Collected:12/31/12 0605     EGFR >60.0 Updated:12/31/12 0732    CBC and differential [[id number]]  (Abnormal) Collected:12/31/12 0604    Specimen Information:Blood / Blood Updated:12/31/12 0712     WBC 10.95 (H)      RBC 4.52 (L)      Hgb 12.6 (L) g/dL      Hematocrit 78.2 (L) %      MCV 84.5 fL      MCH 27.9 (L) pg      MCHC 33.0 g/dL      RDW 14 %      Platelets 301      MPV 10.2 fL      Neutrophils 61 %      Lymphocytes Automated 28 %      Monocytes 9 %      Eosinophils Automated 2 %      Basophils Automated 0 %      Immature Granulocyte Unmeasured %      Nucleated RBC Unmeasured      Neutrophils Absolute 6.70      Abs Lymph Automated 3.01      Abs Mono Automated 1.02      Abs Eos Automated 0.19      Absolute Baso Automated 0.03      Absolute Immature Granulocyte Unmeasured     POCT glucose (AC and HS) [[id number]]  (Abnormal) Collected:12/30/12 2124     POCT Glucose WB 160 (A) mg/dL VHQIONG:29/52/84 1324    POCT glucose (AC and HS) [[id number]] Collected:12/30/12 1803     POCT Glucose WB 86 mg/dL GUYQIHK:74/25/95 6387    Vancomycin, trough [[id number]] Collected:12/30/12 1018    Specimen  Information:Blood Updated:12/30/12 1301     Vancomycin Trough 19.3 ug/mL      Vancomycin Time of Last Dose UNKNOWN      Vancomycin Date of Last Dose 12/29/2012     POCT glucose (AC and HS) [[id number]]  (Abnormal) Collected:12/30/12 1218     POCT Glucose WB 112 (A) mg/dL KZSWFUX:32/35/57 3220    Basic Metabolic Panel [[id number]]  (Abnormal) Collected:12/30/12 1018    Specimen Information:Blood Updated:12/30/12 1057     Glucose 158 (H) mg/dL      BUN 76.2 mg/dL      Creatinine 1.1 mg/dL      CALCIUM 9.3 mg/dL      Sodium 831      Potassium 3.9      Chloride 101      CO2 29      Anion Gap 10.0     GFR [[id number]] Collected:12/30/12 1018     EGFR >60.0 Updated:12/30/12 1057    CBC and differential [[id number]]  (Abnormal) Collected:12/30/12 1018    Specimen Information:Blood / Blood Updated:12/30/12 1039     WBC 10.91 (H)      RBC 4.81      Hgb 13.6 g/dL      Hematocrit 54.6 %      MCV 87.3 fL      MCH 28.3 pg      MCHC 32.4 g/dL      RDW 14 %      Platelets 302      MPV 10.0 fL      Neutrophils 70 %      Lymphocytes Automated 20 %      Monocytes 8 %      Eosinophils Automated 2 %      Basophils Automated 0 %      Immature Granulocyte 0 %  Neutrophils Absolute 7.64      Abs Lymph Automated 2.24      Abs Mono Automated 0.82      Abs Eos Automated 0.18      Absolute Baso Automated 0.03      Absolute Immature Granulocyte 0.03           Rads:     Radiology Results (24 Hour)     ** No Results found for the last 24 hours. **            Signed by: Lambert Mody, MD

## 2012-12-31 NOTE — Discharge Summary (Signed)
[facility] Hospitalist Discharge Note      Date Time: 12/31/2012  9:22 AM  Patient Name:Richard Lawrence  NWG:[id number]  PCP: Royal Piedra, MD  Attending Physician:Chaeli Judy S Ford Peddie M.D.    Hospital Course:   Please see H&P for complete details of HPI and ROS. The patient was admitted to [facility] and has been diagnosed with the following conditions and has been taken care as mentioned below.    Patient Active Problem List    Diagnosis Date Noted   . Facial cellulitis 12/28/2012   . Leucocytosis 12/28/2012   . HTN (hypertension) 12/28/2012   . Type II or unspecified type diabetes mellitus with neurological manifestations, uncontrolled(250.62) 07/06/2012   . Diabetes mellitus without complication 04/22/2012     Patient 62 year old male with hx of diabetes admitted for facial cellulitis, failed out patient treatment. Patient seen by ID, started on Vancomycin and Rocephin with improvement in erythema and swelling. Today erythema almost completely resolved, no swelling. Leucocytosis has resolved no fevers tolerating diet doing well, stable for discharge .   Patient has requested Urology follow up information for urinary obstruction symptoms which has been provided to the patient.   HbA1c at 6.2, with good control of DM, patient is to follow up with nutritionist as outpatient for further help in diet mx for diabetes.     Type of Admission: Inpatient   Medical Necessity for stay: Facial cellulitis     Date of Admission:   12/28/2012    Date of Discharge:   12/31/2012    Chief Complaint:      Chief Complaint   Patient presents with   . Facial Redness        Discharge Diagnosis:     Facial cellulitis  DM type 2  HTN   Leucocytosis resolved   Hospital Problems:  Principal Problem:   *Facial cellulitis  Active Problems:   Diabetes mellitus without complication   Leucocytosis   HTN (hypertension)      Lists the present on admission hospital problems  Present on Admission:   . Facial cellulitis  . Leucocytosis  . Diabetes  mellitus without complication  . HTN (hypertension)        Consult Input/Plan     Plan  None    Procedures performed:   none    Physical Exam:    height is 1.778 m (5\' 10" ) and weight is 116.574 kg (257 lb). His temporal artery temperature is 96.6 F (35.9 C). His blood pressure is 132/81 and his pulse is 69. His respiration is 18 and oxygen saturation is 99%.   Body mass index is 36.88 kg/(m^2).  Filed Vitals:    12/30/12 1439 12/30/12 2310 12/31/12 0153 12/31/12 0512   BP: 143/80 167/80 135/90 132/81   Pulse: 83 77 75 69   Temp: 97.2 F (36.2 C) 97 F (36.1 C) 97 F (36.1 C) 96.6 F (35.9 C)   TempSrc: Temporal Artery Temporal Artery Temporal Artery Temporal Artery   Resp: 18 18 18 18    Height:       Weight:       SpO2: 95% 100% 99% 99%     Intake and Output Summary (Last 24 hours) at Date Time    Intake/Output Summary (Last 24 hours) at 12/31/12 0922  Last data filed at 12/31/12 0525   Gross per 24 hour   Intake    980 ml   Output   2600 ml   Net  -1620 ml  Labs:     Results     Procedure Component Value Units Date/Time    POCT glucose [facility] and HS) [[id number]] Collected:12/31/12 0754     POCT Glucose WB 91 mg/dL WUJWJXB:14/78/29 5621    Basic Metabolic Panel [[id number]] Collected:12/31/12 0605    Specimen Information:Blood Updated:12/31/12 0732     Glucose 98 mg/dL      BUN 96.2 mg/dL      Creatinine 1.0 mg/dL      CALCIUM 9.1 mg/dL      Sodium 952      Potassium 3.9      Chloride 104      CO2 27      Anion Gap 9.0     GFR [[id number]] Collected:12/31/12 0605     EGFR >60.0 Updated:12/31/12 0732    CBC and differential [[id number]]  (Abnormal) Collected:12/31/12 0604    Specimen Information:Blood / Blood Updated:12/31/12 0712     WBC 10.95 (H)      RBC 4.52 (L)      Hgb 12.6 (L) g/dL      Hematocrit 40.3 (L) %      MCV 84.5 fL      MCH 27.9 (L) pg      MCHC 33.0 g/dL      RDW 14 %      Platelets 301      MPV 10.2 fL      Neutrophils 61 %      Lymphocytes Automated 28 %      Monocytes 9 %      Eosinophils  Automated 2 %      Basophils Automated 0 %      Immature Granulocyte Unmeasured %      Nucleated RBC Unmeasured      Neutrophils Absolute 6.70      Abs Lymph Automated 3.01      Abs Mono Automated 1.02      Abs Eos Automated 0.19      Absolute Baso Automated 0.03      Absolute Immature Granulocyte Unmeasured     POCT glucose (AC and HS) [[id number]]  (Abnormal) Collected:12/30/12 2124     POCT Glucose WB 160 (A) mg/dL OVFIEPP:29/51/88 4166    POCT glucose (AC and HS) [[id number]] Collected:12/30/12 1803     POCT Glucose WB 86 mg/dL XNATFTD:32/20/25 4270    Vancomycin, trough [[id number]] Collected:12/30/12 1018    Specimen Information:Blood Updated:12/30/12 1301     Vancomycin Trough 19.3 ug/mL      Vancomycin Time of Last Dose UNKNOWN      Vancomycin Date of Last Dose 12/29/2012     POCT glucose (AC and HS) [[id number]]  (Abnormal) Collected:12/30/12 1218     POCT Glucose WB 112 (A) mg/dL XTGGYIR:48/54/62 7035    Basic Metabolic Panel [[id number]]  (Abnormal) Collected:12/30/12 1018    Specimen Information:Blood Updated:12/30/12 1057     Glucose 158 (H) mg/dL      BUN 93.7 mg/dL      Creatinine 1.1 mg/dL      CALCIUM 9.3 mg/dL      Sodium 169      Potassium 3.9      Chloride 101      CO2 29      Anion Gap 10.0     GFR [[id number]] Collected:12/30/12 1018     EGFR >60.0 Updated:12/30/12 1057    CBC and differential [[id number]]  (Abnormal) Collected:12/30/12 1018    Specimen Information:Blood / Blood Updated:12/30/12 1039     WBC 10.91 (H)  RBC 4.81      Hgb 13.6 g/dL      Hematocrit 30.8 %      MCV 87.3 fL      MCH 28.3 pg      MCHC 32.4 g/dL      RDW 14 %      Platelets 302      MPV 10.0 fL      Neutrophils 70 %      Lymphocytes Automated 20 %      Monocytes 8 %      Eosinophils Automated 2 %      Basophils Automated 0 %      Immature Granulocyte 0 %      Neutrophils Absolute 7.64      Abs Lymph Automated 2.24      Abs Mono Automated 0.82      Abs Eos Automated 0.18      Absolute Baso Automated 0.03      Absolute  Immature Granulocyte 0.03             Rads:   Radiological Procedure reviewed.  No results found.    Discharge Medications:     Please See Discharge Medication reconciliation for the final list of medications.    Pending Labs:   none  Discharge Destination:    home  Condition at Discharge :   Improved    Time spent for Discharge Care:   36 minutes    Follow-up:   Follow up with ID Dr Susy Frizzle in 1 week   Recommended Follow up with PCP in one week.    Signed by: Sheila Oats, MD

## 2012-12-31 NOTE — Progress Notes (Signed)
Ask3Teach3 Program    Education about New Medications and their Side effects    Dear [name],    Its been a pleasure taking care of you during your hospitalization here at [facility]. We have initiated a new program to educate our patients and/or their family members or designated personnel about the new medications started by your physicians and their indications along with the possible side effects. Multiple studies have shown that patients started on new medications are often unaware of the names of the medication along with the indications and their side effects which leads to decreased compliance with the medications.    During our conversation today on 12/31/2012  5:08 PM I have explained to you the name of the new medication and the indication along with some possible common side effects. Listed below are some of the new medications started during this hospitalization.     Please call the Nurse if you have any side effects while in hospital.     Please call 911 if you have any life threatening symptoms after you are discharged from the hospital.    Please inform your Primary care physician for common side effects which are not life threatening after discharge.    Please refer to education sheets provided by nurse    Thank you for your time.    Sheral Apley Milne-Vyge, RN  12/31/2012  5:08 PM  [facility]  [address]  [place], [place]  [address]

## 2013-10-08 ENCOUNTER — Emergency Department
Admission: EM | Admit: 2013-10-08 | Discharge: 2013-10-09 | Disposition: A | Discharge status: Home / Self Care | Payer: BLUE CROSS/BLUE SHIELD | Attending: Emergency Medicine | Admitting: Emergency Medicine

## 2013-10-08 ENCOUNTER — Emergency Department: Payer: BLUE CROSS/BLUE SHIELD

## 2013-10-08 DIAGNOSIS — M5126 Other intervertebral disc displacement, lumbar region: Secondary | ICD-10-CM | POA: Insufficient documentation

## 2013-10-08 DIAGNOSIS — I509 Heart failure, unspecified: Secondary | ICD-10-CM | POA: Insufficient documentation

## 2013-10-08 DIAGNOSIS — I10 Essential (primary) hypertension: Secondary | ICD-10-CM | POA: Insufficient documentation

## 2013-10-08 DIAGNOSIS — E119 Type 2 diabetes mellitus without complications: Secondary | ICD-10-CM | POA: Insufficient documentation

## 2013-10-08 DIAGNOSIS — E785 Hyperlipidemia, unspecified: Secondary | ICD-10-CM | POA: Insufficient documentation

## 2013-10-08 DIAGNOSIS — S61219A Laceration without foreign body of unspecified finger without damage to nail, initial encounter: Secondary | ICD-10-CM

## 2013-10-08 DIAGNOSIS — G4730 Sleep apnea, unspecified: Secondary | ICD-10-CM | POA: Insufficient documentation

## 2013-10-08 DIAGNOSIS — J45909 Unspecified asthma, uncomplicated: Secondary | ICD-10-CM | POA: Insufficient documentation

## 2013-10-08 DIAGNOSIS — W268XXA Contact with other sharp object(s), not elsewhere classified, initial encounter: Secondary | ICD-10-CM | POA: Insufficient documentation

## 2013-10-08 DIAGNOSIS — Z23 Encounter for immunization: Secondary | ICD-10-CM | POA: Insufficient documentation

## 2013-10-08 DIAGNOSIS — S61209A Unspecified open wound of unspecified finger without damage to nail, initial encounter: Secondary | ICD-10-CM | POA: Insufficient documentation

## 2013-10-08 MED ORDER — TETANUS-DIPHTH-ACELL PERTUSSIS 5-2.5-18.5 LF-MCG/0.5 IM SUSP
0.5000 mL | Freq: Once | INTRAMUSCULAR | Status: AC
Start: 2013-10-08 — End: 2013-10-09
  Administered 2013-10-09: 0.5 mL via INTRAMUSCULAR
  Filled 2013-10-08: qty 0.5

## 2013-10-08 NOTE — ED Notes (Signed)
Pt cut left 1st finger on tape dispenser around 11pm tonight. Cut is small, bleeding is controlled, but pt is worried because does not know when last tetanus shot was and is worried because pt is diabetic.

## 2013-10-09 LAB — GLUCOSE WHOLE BLOOD - POCT: Whole Blood Glucose POCT: 104 mg/dL — ABNORMAL HIGH (ref 70–100)

## 2013-10-09 MED ORDER — TETANUS-DIPHTHERIA TOXOIDS TD 5-2 LFU IM INJ
0.5000 mL | INJECTION | Freq: Once | INTRAMUSCULAR | Status: DC
Start: 2013-10-09 — End: 2013-10-09

## 2013-10-09 NOTE — ED Provider Notes (Signed)
Physician/Midlevel provider first contact with patient: 10/09/13 0003         History     Chief Complaint   Patient presents with   . Finger laceration     HPI Comments: 63 y.o. Who cut his distal left index finger on a tape dispenser. He is diabetic and he does not know when his last tetanus shot. Pain 3/10. Throbbing. Nothing improves/worsens.     Patient is a 63 y.o. male presenting with skin laceration. The history is provided by the patient and the spouse.   Laceration   The incident occurred less than 1 hour ago. The laceration is located on the left hand. The laceration is 1 cm in size. The pain is at a severity of 3/10. The pain is mild. The pain has been constant since onset. He reports no foreign bodies present. His tetanus status is out of date.       Past Medical History   Diagnosis Date   . Diabetes mellitus type II    . Sleep apnea      untreated not able to tolerate mask or nose pillow   . Essential hypertension    . Hyperlipidemia    . Asthma    . Herniated disc      L4 L5 L6   . CHF (congestive heart failure)        Past Surgical History   Procedure Date   . Mandible surgery        History reviewed. No pertinent family history.    Social  History   Substance Use Topics   . Smoking status: Never Smoker    . Smokeless tobacco: Not on file   . Alcohol Use: No       .     Allergies   Allergen Reactions   . Erythromycin Diarrhea   . Levaquin (Levofloxacin Hemihydrate) Angioedema   . Lipitor (Atorvastatin Calcium)    . Other      Molds and dust   . Penicillins Hives and Rash       Current/Home Medications    ALBUTEROL (PROVENTIL) (5 MG/ML) 0.5% NEBULIZER SOLUTION    Take 2.5 mg by nebulization every 6 (six) hours as needed.    CARVEDILOL (COREG) 12.5 MG TABLET    Take 12.5 mg by mouth 2 (two) times daily with meals.     FUROSEMIDE (LASIX PO)    Take 60 mg by mouth daily.     LORATADINE (CLARITIN) 10 MG TABLET    Take 20 mg by mouth daily.    METFORMIN (GLUCOPHAGE) 500 MG TABLET    Take 1,000 mg by mouth  2 (two) times daily with meals.    MONTELUKAST (SINGULAIR) 10 MG TABLET    Take 10 mg by mouth nightly.        Review of Systems   Constitutional: Negative for fever and chills.   HENT: Negative for congestion, rhinorrhea and sore throat.    Respiratory: Negative for cough, chest tightness and shortness of breath.    Cardiovascular: Negative for chest pain and palpitations.   Gastrointestinal: Negative for nausea, vomiting, abdominal pain and diarrhea.   Genitourinary: Negative for dysuria and frequency.   Musculoskeletal: Negative for back pain and myalgias.   Skin: Negative for color change and rash.   Neurological: Negative for dizziness and headaches.   Psychiatric/Behavioral: Negative for confusion. The patient is not nervous/anxious.        Physical Exam    BP: 161/78 mmHg, Heart  Rate: 83 , Temp: 97.8 F (36.6 C), Resp Rate: 18 , SpO2: 97 %, Weight: 107.502 kg    Physical Exam   Nursing note and vitals reviewed.  Constitutional: He is oriented to person, place, and time. He appears well-developed and well-nourished.        hypertensive   HENT:   Head: Normocephalic and atraumatic.   Eyes: Conjunctivae normal and EOM are normal.   Neck: Normal range of motion. Neck supple.   Cardiovascular: Normal rate, regular rhythm and normal heart sounds.    Pulmonary/Chest: Effort normal and breath sounds normal. No respiratory distress. He has no wheezes. He has no rales.   Musculoskeletal: Normal range of motion. He exhibits tenderness. He exhibits no edema.        Distal index finger palmar side with 1 cm laceration that is superficial, non bleeding, non gaping   Neurological: He is alert and oriented to person, place, and time. No cranial nerve deficit.   Skin: Skin is warm and dry.   Psychiatric: He has a normal mood and affect. His behavior is normal. Judgment and thought content normal.       MDM and ED Course     ED Medication Orders      Start     Status Ordering Provider    10/09/13 0004      Once,   Status:   Discontinued      Route: Intramuscular  Ordered Dose: 0.5 mL         Discontinued Julianna Vanwagner H    10/08/13 2347   tetanus-diphth-acell pertussis (BOOSTRIX) injection 0.5 mL   Once      Route: Intramuscular  Ordered Dose: 0.5 mL         Last MAR action:  Given RAMSAY, SARAH J                 MDM  Number of Diagnoses or Management Options  Diagnosis management comments: Ddx; laceration without sign of FB or infection  Plan: irrigate, update tetanus    I, Nita Sells, M.D, have been the primary provider for Richard Lawrence during this Emergency Dept visit  Oxygen saturation by pulse oximetry is 95%-100%, Normal.  Interventions: Patient Observed.          Lac Repair  Performed by: Leticia Clas  Authorized by: Nita Sells H  Consent: Verbal consent obtained.  Risks and benefits: risks, benefits and alternatives were discussed  Consent given by: patient  Body area: upper extremity  Location details: left index finger  Laceration length: 1 cm  Tendon involvement: none  Nerve involvement: none  Vascular damage: no  Patient sedated: no  Preparation: Patient was prepped and draped in the usual sterile fashion.  Irrigation solution: saline  Irrigation method: jet lavage  Amount of cleaning: standard  Degree of undermining: none  Skin closure: Steri-Strips  Technique: simple  Approximation: close  Approximation difficulty: simple  Patient tolerance: Patient tolerated the procedure well with no immediate complications.        Clinical Impression & Disposition     Clinical Impression  Final diagnoses:   Laceration of finger, initial encounter        ED Disposition     Discharge Richard Lawrence discharge to home/self care.    Condition at disposition: Stable             New Prescriptions    No medications on file  Leticia Clas, MD  10/09/13 [phone]

## 2013-10-09 NOTE — Discharge Instructions (Signed)
Laceration, General Wound Care    Use the following wound care instructions for your laceration (cut):   Keep the wound clean and dry for the next 24 hours. Avoid excessive moisture. You can wash the wound gently with soap and water. Then apply a dry bandage.   DO NOT allow your wound to soak in water (don't do the dishes or go swimming, for example). You can shower, but do not rub your stitches too hard. Let the wound dry before putting another bandage on.   Take off old dressings every day. Then put on a clean, dry dressing.   If the dressing sticks to the wound, slightly moisten it with water. This way, it can come off more easily.   To help remove a scab, cleanse the area with a mixture of half hydrogen peroxide and half water. This will also help us to take out the sutures when they are ready to be taken out.   Let the area dry thoroughly.   Unless you receive instructions not to do so, you can place a thin layer of antibiotic ointment over the wound. You can buy Polysporin, Bacitracin, or Neosporin at the store. Neosporin can sometimes cause irritation to your skin. If this happens, stop using it and switch to another topical (surface) antibiotic.   If needed, put a clean, dry bandage over the wound to protect it.    Keep the injured area elevated (lifted) for the next 24 hours. This will decrease swelling and pain. You may also want to put ice on the area. Place some ice cubes in a re-sealable (Ziploc) bag and add some water. Put a thin washcloth between the bag and the skin. Apply the ice bag to the area for at least 20 minutes. Do this at least 4 times per day. It is okay to do this more often than directed. You can also do it for longer than directed. NEVER APPLY ICE DIRECTLY TO THE SKIN.    If you had a local anesthetic, it will wear off in about 2 hours. Until then, be careful not to hurt yourself because of having less feeling in the area.    Not all lacerations (cuts) will need  antibiotics. Your doctor may have decided that you need antibiotics to prevent an infection. Be sure to fill the prescription and take all medicines as directed.    If your doctor gave you a prescription for pain medicine, fill the prescription and use the medicine as directed.    YOU SHOULD SEEK MEDICAL ATTENTION IMMEDIATELY, EITHER HERE OR AT THE NEAREST EMERGENCY DEPARTMENT, IF ANY OF THE FOLLOWING OCCURS:   You see redness or swelling.   There are red streaks or there is redness around the wound.   The wound smells bad or has a lot of drainage.   You have fever (temperature higher than 100.4F or 38C), chills, worse pain and / or swelling.

## 2013-10-14 ENCOUNTER — Emergency Department: Payer: BLUE CROSS/BLUE SHIELD

## 2013-10-14 ENCOUNTER — Emergency Department
Admission: EM | Admit: 2013-10-14 | Discharge: 2013-10-14 | Disposition: A | Discharge status: Home / Self Care | Payer: BLUE CROSS/BLUE SHIELD | Attending: Emergency Medicine | Admitting: Emergency Medicine

## 2013-10-14 DIAGNOSIS — N23 Unspecified renal colic: Secondary | ICD-10-CM | POA: Insufficient documentation

## 2013-10-14 DIAGNOSIS — E119 Type 2 diabetes mellitus without complications: Secondary | ICD-10-CM | POA: Insufficient documentation

## 2013-10-14 DIAGNOSIS — I10 Essential (primary) hypertension: Secondary | ICD-10-CM | POA: Insufficient documentation

## 2013-10-14 DIAGNOSIS — J45909 Unspecified asthma, uncomplicated: Secondary | ICD-10-CM | POA: Insufficient documentation

## 2013-10-14 DIAGNOSIS — E785 Hyperlipidemia, unspecified: Secondary | ICD-10-CM | POA: Insufficient documentation

## 2013-10-14 DIAGNOSIS — I509 Heart failure, unspecified: Secondary | ICD-10-CM | POA: Insufficient documentation

## 2013-10-14 LAB — CBC AND DIFFERENTIAL
Basophils Absolute Automated: 0.01 10*3/uL (ref 0.00–0.20)
Basophils Automated: 0 %
Eosinophils Absolute Automated: 0.13 10*3/uL (ref 0.00–0.70)
Eosinophils Automated: 1 %
Hematocrit: 41.2 % — ABNORMAL LOW (ref 42.0–52.0)
Hgb: 13.6 g/dL (ref 13.0–17.0)
Immature Granulocytes Absolute: 0.03 10*3/uL
Immature Granulocytes: 0 %
Lymphocytes Absolute Automated: 1.96 10*3/uL (ref 0.50–4.40)
Lymphocytes Automated: 16 %
MCH: 28.6 pg (ref 28.0–32.0)
MCHC: 33 g/dL (ref 32.0–36.0)
MCV: 86.6 fL (ref 80.0–100.0)
MPV: 9.9 fL (ref 9.4–12.3)
Monocytes Absolute Automated: 0.81 10*3/uL (ref 0.00–1.20)
Monocytes: 7 %
Neutrophils Absolute: 9.06 10*3/uL — ABNORMAL HIGH (ref 1.80–8.10)
Neutrophils: 76 %
Platelets: 325 10*3/uL (ref 140–400)
RBC: 4.76 10*6/uL (ref 4.70–6.00)
RDW: 14 % (ref 12–15)
WBC: 11.97 10*3/uL — ABNORMAL HIGH (ref 3.50–10.80)

## 2013-10-14 LAB — CK: Creatine Kinase (CK): 136 U/L (ref 47–267)

## 2013-10-14 LAB — URINALYSIS
Bilirubin, UA: NEGATIVE
Glucose, UA: NEGATIVE
Ketones UA: NEGATIVE
Leukocyte Esterase, UA: NEGATIVE
Nitrite, UA: NEGATIVE
Protein, UR: NEGATIVE
Specific Gravity UA: 1.018 (ref 1.001–1.035)
Urine pH: 6 (ref 5.0–8.0)
Urobilinogen, UA: 0.2 mg/dL (ref 0.2–2.0)

## 2013-10-14 LAB — COMPREHENSIVE METABOLIC PANEL
ALT: 23 U/L (ref 0–55)
AST (SGOT): 19 U/L (ref 5–34)
Albumin/Globulin Ratio: 1.4 (ref 0.9–2.2)
Albumin: 4.2 g/dL (ref 3.5–5.0)
Alkaline Phosphatase: 95 U/L (ref 38–106)
Anion Gap: 11 (ref 5.0–15.0)
BUN: 22.8 mg/dL (ref 9.0–28.0)
Bilirubin, Total: 0.5 mg/dL (ref 0.2–1.2)
CO2: 26 mEq/L (ref 22–29)
Calcium: 9.7 mg/dL (ref 8.5–10.5)
Chloride: 104 mEq/L (ref 100–111)
Creatinine: 1.2 mg/dL (ref 0.7–1.3)
Globulin: 2.9 g/dL (ref 2.0–3.6)
Glucose: 167 mg/dL — ABNORMAL HIGH (ref 70–100)
Potassium: 4.1 mEq/L (ref 3.5–5.1)
Protein, Total: 7.1 g/dL (ref 6.0–8.3)
Sodium: 141 mEq/L (ref 136–145)

## 2013-10-14 LAB — GFR: EGFR: >60

## 2013-10-14 LAB — URINE MICROSCOPIC

## 2013-10-14 LAB — TROPONIN I: Troponin I: <0.01 ng/mL (ref 0.00–0.09)

## 2013-10-14 MED ORDER — KETOROLAC TROMETHAMINE 30 MG/ML IJ SOLN
30.0000 mg | Freq: Once | INTRAMUSCULAR | Status: AC
Start: 2013-10-14 — End: 2013-10-14
  Administered 2013-10-14: 30 mg via INTRAVENOUS
  Filled 2013-10-14: qty 1

## 2013-10-14 MED ORDER — TAMSULOSIN HCL 0.4 MG PO CAPS
0.4000 mg | ORAL_CAPSULE | Freq: Every day | ORAL | Status: AC
Start: 2013-10-14 — End: 2013-10-21

## 2013-10-14 MED ORDER — OXYCODONE-ACETAMINOPHEN 5-325 MG PO TABS
ORAL_TABLET | ORAL | Status: DC
Start: 2013-10-14 — End: 2017-10-02

## 2013-10-14 MED ORDER — SODIUM CHLORIDE 0.9 % IV BOLUS
1000.0000 mL | Freq: Once | INTRAVENOUS | Status: AC
Start: 2013-10-14 — End: 2013-10-14
  Administered 2013-10-14: 1000 mL via INTRAVENOUS

## 2013-10-14 MED ORDER — HYDROMORPHONE HCL PF 1 MG/ML IJ SOLN
1.0000 mg | Freq: Once | INTRAMUSCULAR | Status: AC
Start: 2013-10-14 — End: 2013-10-14
  Administered 2013-10-14: 1 mg via INTRAVENOUS
  Filled 2013-10-14: qty 1

## 2013-10-14 MED ORDER — ONDANSETRON HCL 4 MG/2ML IJ SOLN
4.0000 mg | Freq: Once | INTRAMUSCULAR | Status: AC
Start: 2013-10-14 — End: 2013-10-14
  Administered 2013-10-14: 4 mg via INTRAVENOUS
  Filled 2013-10-14: qty 2

## 2013-10-14 MED ORDER — TAMSULOSIN HCL 0.4 MG PO CAPS
0.4000 mg | ORAL_CAPSULE | Freq: Once | ORAL | Status: AC
Start: 2013-10-14 — End: 2013-10-14
  Administered 2013-10-14: 0.4 mg via ORAL
  Filled 2013-10-14: qty 1

## 2013-10-14 MED ORDER — KETOROLAC TROMETHAMINE 10 MG PO TABS
10.0000 mg | ORAL_TABLET | Freq: Four times a day (QID) | ORAL | Status: AC | PRN
Start: 2013-10-14 — End: ?

## 2013-10-14 MED ORDER — ONDANSETRON 4 MG PO TBDP
4.0000 mg | ORAL_TABLET | Freq: Four times a day (QID) | ORAL | Status: AC | PRN
Start: 2013-10-14 — End: ?

## 2013-10-14 NOTE — Discharge Instructions (Signed)
Kidney Stones    You have been seen for a kidney stone.    A kidney stone is a hard mineral and crystalline material (a lot like gravel). It forms inside the kidney or the urinary tract. When it moves from the kidney into the tube between the kidney and the bladder (the ureter), it causes severe pain. Kidney stones form when there is less urine (pee) or too many stone-forming substances in the urine. Normally, kidney stones are made of calcium oxalate or calcium phosphate. Kidney stones associated with infection in the urinary tract are called struvite or infection stones.    Symptoms include sharp pain in the side that may radiate (spread) to the groin. Nausea and vomiting are also common. A doctor diagnosed your kidney stone based on your exam, urine test, and medical history. The doctor may have run a special test called a helical CT stone study or an intravenous pyelogram (IVP).    Men get kidney stones more often than women. Whites get them more often than African-Americans. Kidney stones become more common when men reach their 40s. The risk gets higher with age. People who have already had more than one kidney stone often get more stones.    There are different conditions that can cause kidney stones: Hypercalcuria is an inherited (genetic) condition that causes high calcium in the urine. This causes stones in more than half of cases. There are other conditions that cause an increased risk of kidney stones. These include gout (a joint condition), hyperparathyroidism (a hormone condition), inflammatory bowel disease (Crohn's disease and Ulcerative colitis) and intestinal bypass surgery. They also include kidney diseases like renal tubular acidosis. Certain medicines also increase the risk of kidney stones. These include some diuretics (water pills), antacids with calcium, and the HIV drug indinavir (Crixivan).    Most kidney stones pass on their own. Larger stones or stones that don't pass in a few  days may need to be taken out. This is done by a urologist (a doctor who specializes in the urinary tract). Kidney stones are normally treated with pain and nausea medicine.    You should drink lots of fluid--up to 8 glasses of water a day. You should follow up with a urologist in the next 2-3 weeks. Strain all of your urine so you can catch the kidney stone as it passes out of the bladder. Keep the stone and bring it with you to your urology appointment. The urologist may have the stone tested to find out what it is made of. This may help the urologist recommend diet changes. He or she may also suggest medicines to help prevent more kidney stones.    YOU SHOULD SEEK MEDICAL ATTENTION IMMEDIATELY, EITHER HERE OR AT THE NEAREST EMERGENCY DEPARTMENT, IF ANY OF THE FOLLOWING OCCURS:   The pain gets worse or the medicine isn't enough to treat your pain.   You get sick (nausea) or vomit and can t keep down fluids or pain medicine.   You have a fever (temperature higher than 100.4F / 38C) or shaking chills.

## 2013-10-14 NOTE — ED Provider Notes (Signed)
Physician/Midlevel provider first contact with patient: 10/14/13 0940         History     Chief Complaint   Patient presents with   . Abdominal Pain     flank pain      HPI Comments: 63 year old male brought in by daughter with complaint of back pain.  Patient states he was awoken from sleep at 5:30 this morning with sudden onset left lower back pain that radiates to his left lower abdomen.  Pain is severe, constant and lancinating.  Associated with nausea and dry heaving, no diarrhea.  No worsening or improving behaviors or positions.  No history of same.  Denies any urinary symptoms.  Denies any chest pain, palpitations or shortness of breath.  History of renal colic.  No history of chronic back pain.      Patient is a 63 y.o. male presenting with abdominal pain. The history is provided by a relative and the patient (Daughter). No language interpreter was used.   Abdominal Pain  The primary symptoms of the illness include abdominal pain and nausea. The primary symptoms of the illness do not include fever, fatigue, shortness of breath, vomiting, diarrhea, hematemesis, hematochezia or dysuria. The current episode started 3 to 5 hours ago. The onset of the illness was sudden. The problem has been gradually worsening.   The abdominal pain is located in the LLQ. The abdominal pain radiates to the back. The severity of the abdominal pain is 10/10. The abdominal pain is relieved by nothing.   Nausea began today.   Additional symptoms associated with the illness include chills, anorexia, diaphoresis and back pain. Symptoms associated with the illness do not include heartburn, constipation, urgency, hematuria or frequency.       Past Medical History   Diagnosis Date   . Diabetes mellitus type II    . Sleep apnea      untreated not able to tolerate mask or nose pillow   . Essential hypertension    . Hyperlipidemia    . Asthma    . Herniated disc      L4 L5 L6   . CHF (congestive heart failure)        Past Surgical History    Procedure Date   . Mandible surgery        No family history on file.    Social  History   Substance Use Topics   . Smoking status: Never Smoker    . Smokeless tobacco: Not on file   . Alcohol Use: No       .     Allergies   Allergen Reactions   . Erythromycin Diarrhea   . Levaquin (Levofloxacin Hemihydrate) Angioedema   . Lipitor (Atorvastatin Calcium)    . Other      Molds and dust   . Penicillins Hives and Rash       Current/Home Medications    ALBUTEROL (PROVENTIL) (5 MG/ML) 0.5% NEBULIZER SOLUTION    Take 2.5 mg by nebulization every 6 (six) hours as needed.    CARVEDILOL (COREG) 12.5 MG TABLET    Take 12.5 mg by mouth 2 (two) times daily with meals.     FUROSEMIDE (LASIX PO)    Take 60 mg by mouth daily.     LORATADINE (CLARITIN) 10 MG TABLET    Take 20 mg by mouth daily.    METFORMIN (GLUCOPHAGE) 500 MG TABLET    Take 1,000 mg by mouth 2 (two) times daily with meals.  MONTELUKAST (SINGULAIR) 10 MG TABLET    Take 10 mg by mouth nightly.        Review of Systems   Constitutional: Positive for chills and diaphoresis. Negative for fever and fatigue.   Respiratory: Negative for cough, chest tightness and shortness of breath.    Cardiovascular: Negative for chest pain and palpitations.   Gastrointestinal: Positive for nausea, abdominal pain and anorexia. Negative for heartburn, vomiting, diarrhea, constipation, blood in stool, hematochezia, abdominal distention, anal bleeding, rectal pain and hematemesis.   Genitourinary: Negative for dysuria, urgency, frequency, hematuria, flank pain, discharge, penile swelling, scrotal swelling, difficulty urinating, genital sores, penile pain and testicular pain.   Musculoskeletal: Positive for back pain. Negative for arthralgias, gait problem, joint swelling, myalgias, neck pain and neck stiffness.   Neurological: Negative for dizziness and weakness.   All other systems reviewed and are negative.        Physical Exam    BP: 209/95 mmHg, Heart Rate: 71 , Temp: 97 F (36.1  C), Resp Rate: 18 , SpO2: 98 %    Physical Exam   Nursing note and vitals reviewed.  Constitutional: He appears well-developed and well-nourished.  Non-toxic appearance. He does not have a sickly appearance. He appears ill. No distress.        Appears uncomfortable.   HENT:   Head: Normocephalic.   Mouth/Throat: Uvula is midline, oropharynx is clear and moist and mucous membranes are normal.   Eyes: EOM are normal.   Neck: Normal range of motion. Neck supple.   Cardiovascular: Normal rate, S1 normal, S2 normal and normal pulses.    Pulmonary/Chest: Effort normal and breath sounds normal. No respiratory distress. He has no decreased breath sounds. He has no wheezes. He has no rhonchi. He has no rales. He exhibits no tenderness.   Abdominal: Soft. Bowel sounds are normal. He exhibits no shifting dullness, no distension, no pulsatile liver, no fluid wave, no abdominal bruit, no ascites, no pulsatile midline mass and no mass. There is no hepatosplenomegaly, splenomegaly or hepatomegaly. There is no tenderness. There is CVA tenderness. There is no rigidity, no rebound, no guarding, no tenderness at McBurney's point and negative Murphy's sign. No hernia. Hernia confirmed negative in the ventral area.            History of nonreproducible pain to left lower quadrant.  Significant abdominal obesity limits exam.   Musculoskeletal:        Right hip: Normal.        Left hip: Normal.        Thoracic back: Normal.        Lumbar back: Normal.        Arms:       Left-sided paraspinal tenderness to just inferior to the costovertebral angle.   Neurological: He is alert.   Skin: Skin is warm. No rash noted. He is diaphoretic. No erythema. No pallor.   Psychiatric: His behavior is normal. Judgment and thought content normal.       MDM and ED Course     ED Medication Orders      Start     Status Ordering Provider    10/14/13 1149   HYDROmorphone (DILAUDID) injection 1 mg   Once      Route: Intravenous  Ordered Dose: 1 mg          Ordered Rylann Munford EDWIN    10/14/13 0948   tamsulosin (FLOMAX) capsule 0.4 mg   Once      Route:  Oral  Ordered Dose: 0.4 mg         Last MAR action:  Given Daanish Copes EDWIN    10/14/13 0947   sodium chloride 0.9 % bolus 1,000 mL   Once      Route: Intravenous  Ordered Dose: 1,000 mL         Last MAR action:  New Bag Taysha Majewski EDWIN    10/14/13 0947   ketorolac (TORADOL) injection 30 mg   Once      Route: Intravenous  Ordered Dose: 30 mg         Last MAR action:  Given Alanya Vukelich EDWIN    10/14/13 0947   ondansetron (ZOFRAN) injection 4 mg   Once      Route: Intravenous  Ordered Dose: 4 mg         Last MAR action:  Given Styles Fambro EDWIN    10/14/13 0947   HYDROmorphone (DILAUDID) injection 1 mg   Once      Route: Intravenous  Ordered Dose: 1 mg         Last MAR action:  Given Shawndra Clute EDWIN                 MDM  Number of Diagnoses or Management Options  Renal colic: new and requires workup  Diagnosis management comments:   I, Adelene Idler, Physician Assistant, have been the primary provider for this pt during their ED stay.     The attending signature signifies review and agreement of the history, physical examination, evaluation, clinical impression and plan except as noted.     02 sat is 98 percent on  ra, which is nml.     Patient with sudden onset of flank pain associated with diaphoresis, nausea.    Differential diagnosis includes renal colic, pyelonephritis.  Low suspicion for intra-abdominal process such as diverticulitis, small bowel obstruction or appendicitis.    Given patient age, associated diaphoresis and nausea, lack of urinary symptoms with associated diabetes and hypertension, age, and central obesity, I will obtain EKG with cardiac enzymes, rule out atypical presentation for acute MI.    Lab/Rad  results reviewed include:     EKG demonstrates NSR at 69 bpm with PVCs, left bundle block, no st changes.  T-wave inversions on aVR and V1 are unchanged from previous EKG.    1030 - patient blood pressure is  improved to 160/90, patient pain is improved.  Urinalysis demonstrates 11-25 red blood cells with moderate blood, no infection.  Unactionable Complete metabolic panel.  CK and troponin are normal, mild leukocytosis of 11.97, otherwise unactionable complete blood count.    CT scan demonstrates left-sided intraureteral 3 mm calcifications without hydronephrosis.    Patient is feeling much better.  On reevaluation, repeat abdominal exam remains benign.  He understands discharge instructions, need for urology follow-up and strict ED return precautions           Amount and/or Complexity of Data Reviewed  Clinical lab tests: ordered  Tests in the radiology section of CPT: ordered and reviewed  Tests in the medicine section of CPT: ordered and reviewed  Obtain history from someone other than the patient: yes  Review and summarize past medical records: yes  Discuss the patient with other providers: yes    Risk of Complications, Morbidity, and/or Mortality  Presenting problems: moderate  Diagnostic procedures: moderate  Management options: moderate    Patient Progress  Patient progress: improved  Procedures    Clinical Impression & Disposition     Clinical Impression  Final diagnoses:   Renal colic        ED Disposition     Discharge Stevin Stines discharge to home/self care.    Condition at disposition: Stable             New Prescriptions    KETOROLAC (TORADOL) 10 MG TABLET    Take 1 tablet (10 mg total) by mouth every 6 (six) hours as needed for Pain.    ONDANSETRON (ZOFRAN ODT) 4 MG DISINTEGRATING TABLET    Take 1 tablet (4 mg total) by mouth every 6 (six) hours as needed for Nausea.    OXYCODONE-ACETAMINOPHEN (PERCOCET) 5-325 MG PER TABLET    1-2 tablets by mouth every 4-6 hours as needed for pain;  Do not drive or operate machinery while taking this medicine    TAMSULOSIN (FLOMAX) 0.4 MG CAP    Take 1 capsule (0.4 mg total) by mouth daily. Discontinue for lightheadedness                 Jamse Arn,  [place]  10/14/13 1152

## 2013-10-14 NOTE — ED Notes (Signed)
Patient presents to the ER with left flank pain since 0500 and has been dry heaving since then. His pain is 8/10 and constant. Denies any pain with urination no bowel complaints. The patient is feeling nauseous. Denies any previous illness.

## 2013-10-15 LAB — ECG 12-LEAD
Atrial Rate: 69 {beats}/min
P Axis: 29 degrees
P-R Interval: 168 ms
Q-T Interval: 398 ms
QRS Duration: 94 ms
QTC Calculation (Bezet): 426 ms
R Axis: 11 degrees
T Axis: 12 degrees
Ventricular Rate: 69 {beats}/min

## 2013-10-15 NOTE — ED Provider Notes (Signed)
I, Nita Sells, M.D., have personally seen and examined this patient, and have fully participated in his care.  I agree with all pertinent and available clinical information, including history, physical examination, assessment, and plan as documented by the [facility] except as noted.    On my exam, pt uncomfortable. C/o left flank pain. Ct scan shows stone. Will f/u with urology after pain control.    Leticia Clas, MD  10/15/13 1024

## 2014-07-06 ENCOUNTER — Ambulatory Visit (INDEPENDENT_AMBULATORY_CARE_PROVIDER_SITE_OTHER): Payer: Self-pay | Admitting: Cardiovascular Disease

## 2015-08-02 ENCOUNTER — Ambulatory Visit (INDEPENDENT_AMBULATORY_CARE_PROVIDER_SITE_OTHER): Payer: Self-pay | Admitting: Cardiovascular Disease

## 2015-10-03 ENCOUNTER — Ambulatory Visit (INDEPENDENT_AMBULATORY_CARE_PROVIDER_SITE_OTHER): Payer: BLUE CROSS/BLUE SHIELD | Admitting: Internal Medicine

## 2020-09-18 NOTE — Progress Notes (Signed)
[facility] OFFICE  [address] [place], [place] [address]     Richard Richard Lawrence    Date of Visit:  07/06/2014  Date of Birth: July 10, 1950  Age: 70 yrs.   Medical Record Number: [id number]  __  CURRENT DIAGNOSES     1. Other obesity, E66.8  2. Mixed  hyperlipidemia, E78.2  3. Essential (primary) hypertension, I10  __  ALLERGIES    Erythromycin  Base, Intolerance-diarrhea  Hydrochlorothiazide, Rash  Levofloxacin, Intolerance-unknown  Penicillins, Rash  __  MEDICATIONS      1. Singulair 10 mg Tablet, 1 po qd  2. Claritin 10 mg Tablet, 1 po qd  3. albuterol sulfate 90 mcg/Actuation HFA Aerosol Inhaler, PRN  4. Glucophage 500 mg tablet, po bid  5. Lasix 40 mg tablet, 1 1/2 tab po qd  6. Coreg  12.5 Mg Tablet, 1 po bid  __  CHIEF COMPLAINT/REASON FOR VISIT  Followup of Essential (primary) hypertension  __   HISTORY OF PRESENT ILLNESS  Richard Richard Lawrence is a pleasant 70 year old gentleman who presents for a followup visit. He has been doing well over the last year. The patient lost quite a bit of weight and then unfortunately  gained some of it back. Nonetheless, he is about 15 pounds lighter than he was a year ago. Blood pressure is under control as are his lipids which he is having followed by his primary care physician. He has no cardiovascular complaints.     __   PAST HISTORY     Past Medical Illnesses: Lymes Disease, sleep apnea, Asthma;   Past Cardiac Illnesses: No previous history of cardiac disease.; Infectious Diseases : Usual childhood illnesses of mumps, measles and chickenpox, history of Scarlet Fever; Surgical Procedures: No previous surgical procedures.;  Trauma History: fx jaw 1988, dental surgery 12/09 & 2/10; Cardiology Procedures-Invasive: No previous interventional  or invasive cardiology procedures.; Cardiology Procedures-Noninvasive: Echocardiogram April 2011; Left  Ventricular Ejection Fraction: LVEF of 65% documented via echocardiogram on 09/28/2009  ___  FAMILY  HISTORY  Father -- Congestive heart  failure (PATERNAL GRANDMOTHER)  Father --  Coronary Artery Disease (CABG), Congestive heart failure  Mother -- Congestive heart failure   Mother -- Diabetes mellitus (MATERNAL GRANDMOTHER), Congestive heart failure (MATERNAL GRANDMOTHER)    __  CARDIAC RISK FACTORS      Tobacco Abuse: has never used tobacco; Family History of Heart Disease: positive;  Hyperlipidemia: positive; Hypertension: positive;   Diabetes Mellitus: positive; Prior History of Heart Disease: negative;  Obesity: positive, BMI25 (Over weight); Sedentary Life Style:negative;  JYN:WGNFAOZH; Menopausal:not applicable  __   SOCIAL HISTORY    Alcohol Use: Does not use alcohol;  Smoking: never smoked; Never smoker ([id number]); Diet: Regular diet and Caffeine use-rare;  Exercise: Some exercise and walking;   __  PHYSICAL EXAMINATION    Vital Signs:   Blood Pressure:  142/82 Sitting, Left arm, large cuff  144/80 Sitting, Right arm, large cuff    Weight:  233.80 lbs.  Height: 71"  BMI: 32    Pulse: 76/min. Radial       Constitutional: Cooperative, alert and oriented,well developed,  well nourished, in no acute distress., moderately obese  Neck: carotid pulses are full and equal bilaterally,  no JVD Chest: clear to auscultation and percussion Cardiac : Regular rhythm, S1 normal, S2 normal, No S3 or S4, Apical impulse not displaced, no murmurs, gallops or rubs detected., distant heart sounds Abdomen : abdomen unremarkable, abdomen soft Peripheral Pulses: The dorsalis pedis, and posterior tibial pulses are  full and equal bilaterally  Extremities/Back: 1+ bilateral pretibial edema Neurological: affect appropriate   __     Medications added today by the physician:  Coreg 12.5 mg tablet, 1 po bid, 180      IMPRESSION:  1. 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   2. Normal  ejection fraction with no significant valve disease by echocardiogram April   of 2011.   3. Diabetes.  4. Obesity. Decreasing weight with light exercise.  Cannot afford Ideal Protein.  5. Untreated sleep apnea due to intolerance of CPAP  mask.   6. Hyperlipidemia. LDL goal 70. Intolerant to lipitor.   7. Patient has requested to defer screening stress testing because father had cardiac   event after stress test.  8. Peripheral edema. Intolerant of compression stockings.       RECOMMENDATIONS:  Aurther is stable. His blood pressure is a bit elevated today but this was from a high-salt dinner last night. In general it is better controlled at home. He has no other complaints today. He can follow up with [place] in one year.      Richard Richard Lawrence Richard Richard Lawrence, M.D., F.LawrenceC.C.    TAZ/tuarg    cc: Richard Neth MD  Richard Richard Lofty  MD    rw      ____________________________  [name]  Patient Electronic Access Today  Diet mgmt edu, guidance and counseling  TODAY  Return Visit 15 MIN 1 year        Dorthula Nettles, MD, [facility]

## 2020-09-18 NOTE — Progress Notes (Signed)
[facility] OFFICE  [address] [place], [place] [address]     Richard Lawrence    Date of Visit:  08/02/2015  Date of Birth: [birth year]  Age: 70 yrs.   Medical Record Number: [id number]  __  CURRENT DIAGNOSES     1. Other obesity, E66.8  2. Mixed  hyperlipidemia, E78.2  3. Essential (primary) hypertension, I10  __  ALLERGIES    Erythromycin  Base, Intolerance-diarrhea  Hydrochlorothiazide, Rash  Levofloxacin, Intolerance-unknown  Penicillins, Rash  __  MEDICATIONS      1. Singulair 10 mg Tablet, 1 po qd  2. Claritin 10 mg Tablet, 1 po qd  3. albuterol sulfate 90 mcg/Actuation HFA Aerosol Inhaler, PRN  4. Glucophage 500 mg tablet, po bid  5. Lasix 40 mg tablet, 1 1/2 tab po qd  6. Coreg  12.5 Mg Tablet, 1 po bid  7. Glucophage 1,000 mg tablet, 2 QD  8. Lasix 40 mg tablet, 1 and 1/2 tab daily (60 mg)  __  CHIEF COMPLAINT/REASON FOR VISIT   Followup of Essential (primary) hypertension  __  HISTORY OF PRESENT ILLNESS  Richard Lawrence is a pleasant 70 year old gentleman who presents for a followup  visit. His blood pressure has been elevated lately because he is under significant stress. He is moving to [place]. The move got rushed and he has to be out of his house in the next couple of weeks. The patient was up until 3:00 a.m. packing last night.  He is not having chest pain. He has gained weight because he has not been exercising. He is hoping that all of these trends will reverse when he gets down to [place].   __  PAST HISTORY      Past Medical Illnesses: Lymes Disease, sleep apnea, Asthma;  Past Cardiac Illnesses : No previous history of cardiac disease.; Infectious Diseases: Usual childhood illnesses of mumps, measles and chickenpox, history of Scarlet Fever;  Surgical Procedures: No previous surgical procedures.; Trauma History: fx jaw 1988, dental surgery 12/09  & 2/10; Cardiology Procedures-Invasive: No previous interventional or invasive cardiology procedures.;  Cardiology Procedures-Noninvasive:  Echocardiogram April 2011; Left Ventricular Ejection Fraction: LVEF  of 65% documented via echocardiogram on 09/28/2009  ___  FAMILY HISTORY  Father --  Congestive heart failure (PATERNAL GRANDMOTHER)  Father -- Coronary Artery Disease (CABG), Congestive heart failure   Mother -- Congestive heart failure  Mother -- Diabetes mellitus (MATERNAL GRANDMOTHER), Congestive  heart failure (MATERNAL GRANDMOTHER)    __  CARDIAC RISK FACTORS     Tobacco Abuse: has never used tobacco;  Family History of Heart Disease: positive; Hyperlipidemia: positive;  Hypertension: positive;  Diabetes Mellitus: positive;  Prior History of Heart Disease: negative; Obesity: positive, BMI25 (Over weight);  Sedentary Life Style:negative; JYN:WGNFAOZH; Menopausal :not applicable  __  SOCIAL HISTORY     Alcohol Use: Does not use alcohol; Smoking: never smoked; Never smoker ([id number]);  Diet: Regular diet and Caffeine use-rare; Exercise: Some exercise and walking;   __   PHYSICAL EXAMINATION    Vital Signs:  Blood Pressure:  154/88 Sitting, Left arm, large  cuff  154/90 Sitting, Right arm, large cuff  148/82 Retaken by MD    Weight: 238.00 lbs.   Height: 71"  BMI: 33   Pulse:  76/min. Apical   Respirations: 18/min.       Constitutional:  Cooperative, alert and oriented,well developed, well nourished, in no acute distress., moderately obese  Neck : carotid pulses are full and equal bilaterally, no JVD Chest:  clear to auscultation and percussion  Cardiac: Regular rhythm, S1 normal, S2 normal, No S3 or S4, Apical impulse not displaced, no murmurs, gallops or rubs detected., distant heart sounds  Abdomen: abdomen unremarkable, abdomen soft Peripheral Pulses: The dorsalis pedis, and posterior tibial  pulses are full and equal bilaterally Extremities/Back: 1+ bilateral pretibial edema Neurological : affect appropriate   __    Medications added today by the physician:  Lasix 40 mg tablet, 1 and 1/2 tab daily (60 mg), 60    IMPRESSION:   1. Hypertension  with hypertensive heart disease and diastolic dysfunction.   2. Normal ejection fraction with no significant valve disease by echocardiogram April of 2011.   3. Diabetes.  4. Obesity. Decreasing weight with light exercise.  Cannot afford Ideal Protein.  5. Untreated sleep apnea due to intolerance of CPAP mask.   6. Hyperlipidemia. LDL goal 70. Intolerant to lipitor.   7. Patient has requested to defer screening stress testing because father had cardiac event  after  stress test.  8. Peripheral edema. Intolerant of compression stockings.    RECOMMENDATIONS:  Richard Lawrence seems to be doing well except for the recent stressors causing his blood pressure to go up. I would like to see him exercise more.  He is going to bring me a copy of his blood tests that he had done recently. We will plan a followup visit in six months. He said he will be back in town at that point.     Xoe Hoe A. Marnette Burgess, MD, [facility]    TAZ/tuarg     eca   ____________________________  [name]

## 2023-06-09 IMAGING — MR MRI BRAIN W/O CONTRAST
6 series · 48 of 48 positions shown · IV contrast (Off)
Comparison: Clinical history states follow-up for a CVA, however, no prior imaging was provided for comparison purposes to determine stability.

MRI OF THE BRAIN WITHOUT CONTRAST
CLINICAL HISTORY: Previous CVA follow-up.
TECHNIQUE: Multisequential multiplanar imaging was performed of the brain, including diffusion-weighted imaging.

[Series 2: T1 · sagittal · 5.0mm · 0.90mm/px · 7 of 21 slices shown (1 of 2)]
[im 1/21]
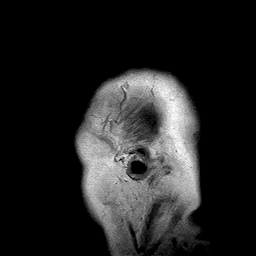
[im 4/21]
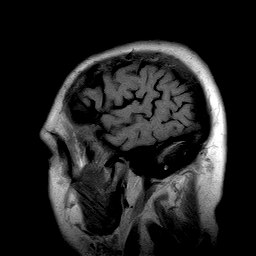
[im 7/21]
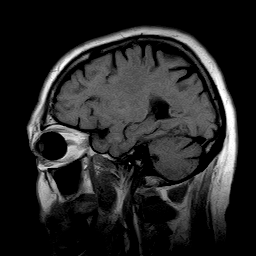
[im 11/21]
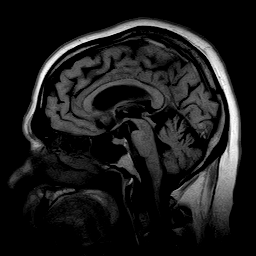
[im 14/21]
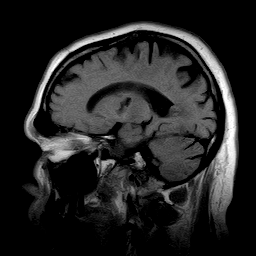
[im 17/21]
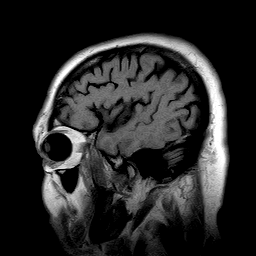
[im 21/21]
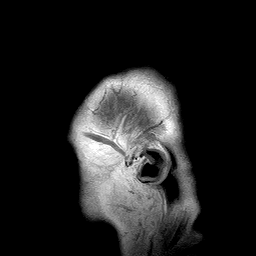

[Series 3: T2 · axial · 5.0mm · 0.94mm/px · z∈[-61,+86]mm · 9 of 22 slices shown]
[im 1/22]
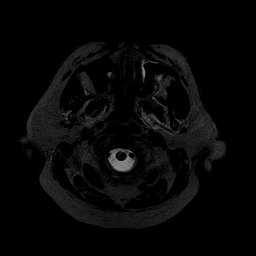
[im 3/22]
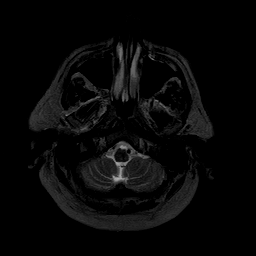
[im 6/22]
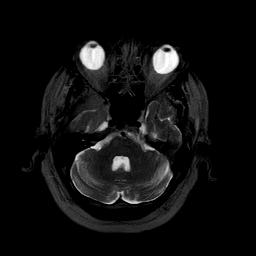
[im 8/22]
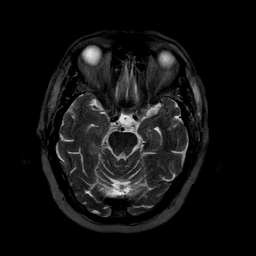
[im 11/22]
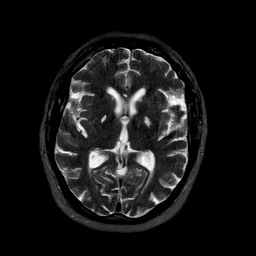
[im 14/22]
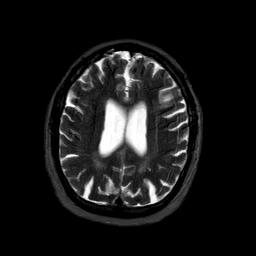
[im 16/22]
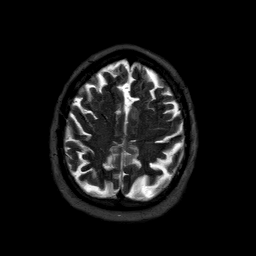
[im 19/22]
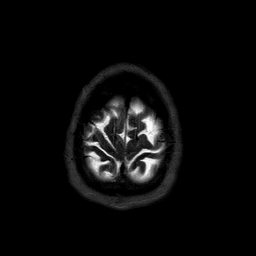
[im 22/22]
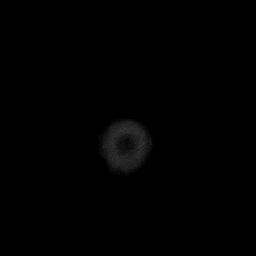

[Series 4: T1 · axial · 5.0mm · 0.94mm/px · z∈[-61,+86]mm · 9 of 22 slices shown (2 of 2)]
[im 1/22]
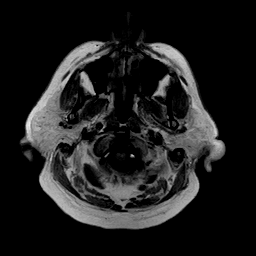
[im 3/22]
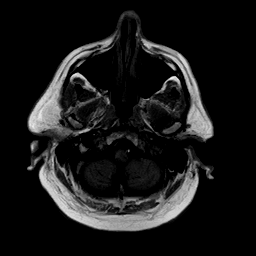
[im 6/22]
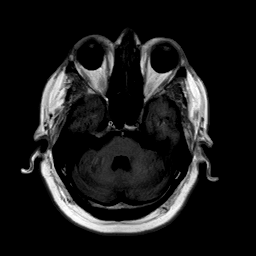
[im 8/22]
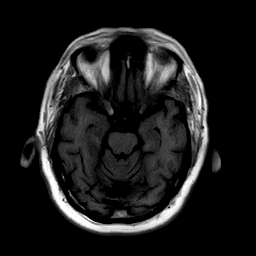
[im 11/22]
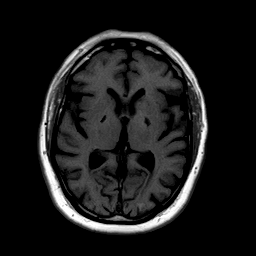
[im 14/22]
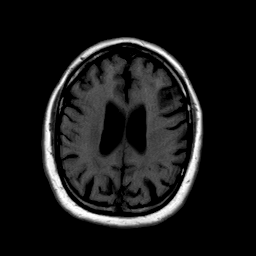
[im 16/22]
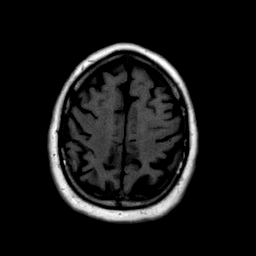
[im 19/22]
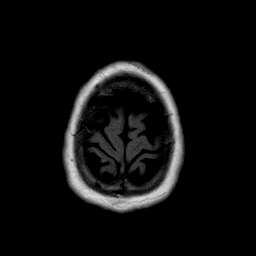
[im 22/22]
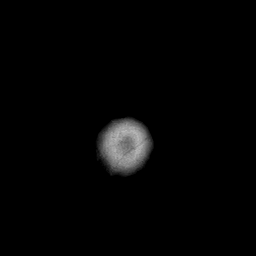

[Series 5: FLAIR · axial · 5.0mm · 0.94mm/px · z∈[-61,+86]mm · 9 of 22 slices shown]
[im 1/22]
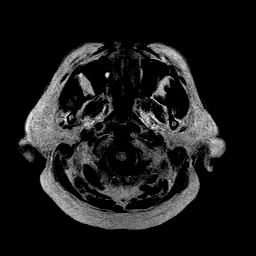
[im 3/22]
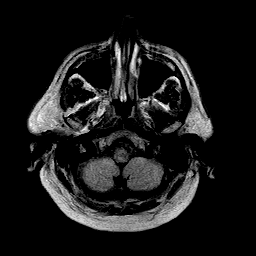
[im 6/22]
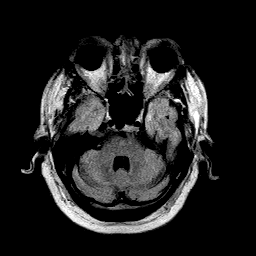
[im 8/22]
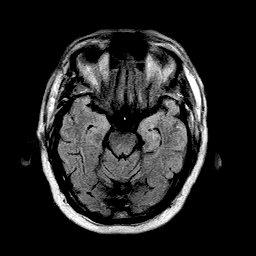
[im 11/22]
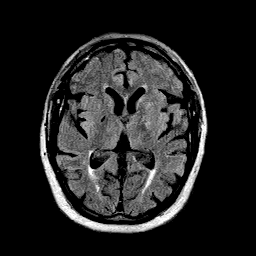
[im 14/22]
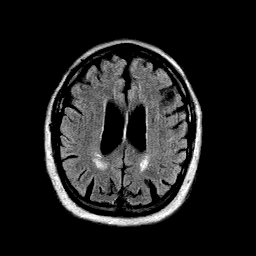
[im 16/22]
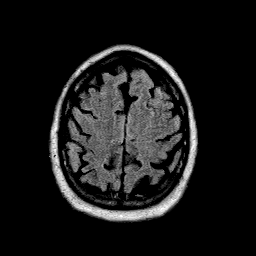
[im 19/22]
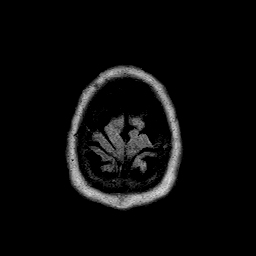
[im 22/22]
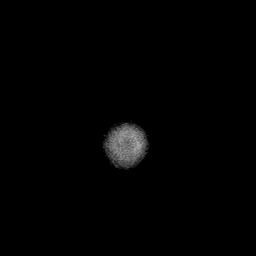

[Series 11: DWI · axial · 6.0mm · 1.88mm/px · z∈[-59,+77]mm · 7 of 18 slices shown]
[im 1/18]
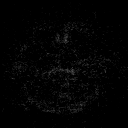
[im 3/18]
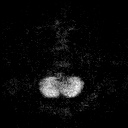
[im 6/18]
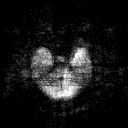
[im 9/18]
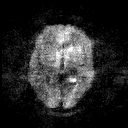
[im 12/18]
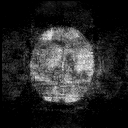
[im 15/18]
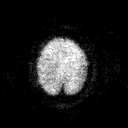
[im 18/18]
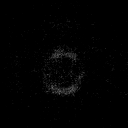

[Series 12: ADC · axial · 6.0mm · 1.88mm/px · z∈[-59,+77]mm · 7 of 18 slices shown]
[im 1/18]
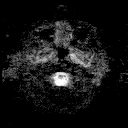
[im 3/18]
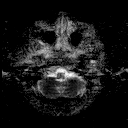
[im 6/18]
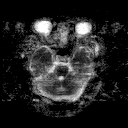
[im 9/18]
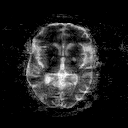
[im 12/18]
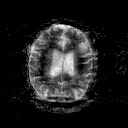
[im 15/18]
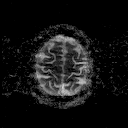
[im 18/18]
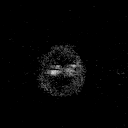

[48 of 48 positions shown; findings below may reference images not displayed]

FINDINGS: Examination is limited secondary to motion artifact on several sequences. 

There is no MRI evidence of hemorrhage, mass effect, midline shift, extra-axial fluid collections, or hydrocephalus. Involutional change noted with prominence of the ventricles and sulci as well as significant demyelination in the periventricular and deep white matter. Areas of increased T2 signal intensity in the basal ganglia bilaterally. There is no evidence of restricted diffusion.
IMPRESSION: 1.
Limited sensitivity of the examination secondary to motion, as noted. 

2.
Involutional change with prominence of the ventricles and sulci as well as significant demyelination in the periventricular and deep white matter consistent with small vessel ischemic change and/or ischemic leukomalacia. Focal areas of increased T2 signal intensity in the basal ganglia bilaterally, especially on the left side. Area in the left basal ganglia may represent a more focal area of ischemic event. Unfortunately we do not have prior examinations for comparison purposes to ensure stability. 

3.
No evidence of acute or subacute ischemia or other cause of restricted diffusion.
